# Patient Record
Sex: Female | Born: 1955
Health system: Southern US, Community
[De-identification: ages and names within clinical notes are randomized; demographics above are authoritative.]

## PROBLEM LIST (undated history)

## (undated) DIAGNOSIS — R112 Nausea with vomiting, unspecified: Secondary | ICD-10-CM

## (undated) DIAGNOSIS — C44719 Basal cell carcinoma of skin of left lower limb, including hip: Secondary | ICD-10-CM

## (undated) DIAGNOSIS — Z9889 Other specified postprocedural states: Secondary | ICD-10-CM

## (undated) DIAGNOSIS — E785 Hyperlipidemia, unspecified: Secondary | ICD-10-CM

## (undated) HISTORY — PX: WISDOM TOOTH EXTRACTION: SHX21

## (undated) HISTORY — PX: TUBAL LIGATION: SHX77

## (undated) HISTORY — PX: TONSILLECTOMY: SUR1361

## (undated) HISTORY — PX: MOHS SURGERY: SUR867

## (undated) HISTORY — PX: RHINOPLASTY: SUR1284

## (undated) HISTORY — PX: COLONOSCOPY: SHX174

## (undated) HISTORY — PX: DILATION AND CURETTAGE OF UTERUS: SHX78

## (undated) HISTORY — PX: TONSILLECTOMY AND ADENOIDECTOMY: SHX28

---

## 2003-12-12 ENCOUNTER — Other Ambulatory Visit: Admission: RE | Admit: 2003-12-12 | Discharge: 2003-12-12 | Payer: Self-pay | Admitting: Family Medicine

## 2005-03-04 ENCOUNTER — Other Ambulatory Visit: Admission: RE | Admit: 2005-03-04 | Discharge: 2005-03-04 | Payer: Self-pay | Admitting: Family Medicine

## 2005-03-12 ENCOUNTER — Encounter: Admission: RE | Admit: 2005-03-12 | Discharge: 2005-03-12 | Payer: Self-pay | Admitting: Family Medicine

## 2005-04-02 ENCOUNTER — Ambulatory Visit (HOSPITAL_COMMUNITY): Admission: RE | Admit: 2005-04-02 | Discharge: 2005-04-02 | Payer: Self-pay | Admitting: Obstetrics and Gynecology

## 2005-05-05 ENCOUNTER — Encounter (INDEPENDENT_AMBULATORY_CARE_PROVIDER_SITE_OTHER): Payer: Self-pay | Admitting: *Deleted

## 2005-05-05 ENCOUNTER — Ambulatory Visit (HOSPITAL_COMMUNITY): Admission: RE | Admit: 2005-05-05 | Discharge: 2005-05-05 | Payer: Self-pay | Admitting: Obstetrics and Gynecology

## 2006-03-11 ENCOUNTER — Other Ambulatory Visit: Admission: RE | Admit: 2006-03-11 | Discharge: 2006-03-11 | Payer: Self-pay | Admitting: Family Medicine

## 2006-11-08 ENCOUNTER — Encounter: Admission: RE | Admit: 2006-11-08 | Discharge: 2006-11-08 | Payer: Self-pay | Admitting: Family Medicine

## 2007-03-24 ENCOUNTER — Other Ambulatory Visit: Admission: RE | Admit: 2007-03-24 | Discharge: 2007-03-24 | Payer: Self-pay | Admitting: Family Medicine

## 2007-04-02 ENCOUNTER — Ambulatory Visit (HOSPITAL_COMMUNITY): Admission: RE | Admit: 2007-04-02 | Discharge: 2007-04-02 | Payer: Self-pay | Admitting: Family Medicine

## 2008-05-03 ENCOUNTER — Other Ambulatory Visit: Admission: RE | Admit: 2008-05-03 | Discharge: 2008-05-03 | Payer: Self-pay | Admitting: Family Medicine

## 2009-05-23 ENCOUNTER — Other Ambulatory Visit: Admission: RE | Admit: 2009-05-23 | Discharge: 2009-05-23 | Payer: Self-pay | Admitting: Family Medicine

## 2010-07-02 ENCOUNTER — Other Ambulatory Visit
Admission: RE | Admit: 2010-07-02 | Discharge: 2010-07-02 | Payer: Self-pay | Source: Home / Self Care | Admitting: Family Medicine

## 2010-07-16 ENCOUNTER — Other Ambulatory Visit: Payer: Self-pay | Admitting: Obstetrics and Gynecology

## 2010-11-08 NOTE — Op Note (Signed)
NAME:  Christine Austin, Christine Austin           ACCOUNT NO.:  [id number]   MEDICAL RECORD NO.:  [id number]          PATIENT TYPE:  AMB   LOCATION:  [facility]                           FACILITY:  [facility]   PHYSICIAN:  Dois Davenport A. Rivard, M.D. DATE OF BIRTH:  1955/08/06   DATE OF PROCEDURE:  05/05/2005  DATE OF DISCHARGE:                                 OPERATIVE REPORT   PREOPERATIVE DIAGNOSIS:  Menorrhagia with endometrial polyp.   POSTOPERATIVE DIAGNOSIS:  Menorrhagia with endometrial polyp.  Endocervical  polyp.   PROCEDURE:  Hysteroscopy, resection of endometrial polyp, D&C, and removal  of endocervical polyp.   SURGEON:  Crist Fat. Rivard, M.D.   ANESTHESIA:  General.   ESTIMATED BLOOD LOSS:  Minimal.   DESCRIPTION OF PROCEDURE:  After being informed of the planned procedure  with the possible complications including bleeding, infection, uterine  perforation, informed consent was obtained.  The patient is taken to OR#4  and given general anesthesia with laryngeal mask.  She is placed in the  lithotomy position and prepped and draped in the usual sterile fashion.  Her  bladder is emptied with an in-and-out Foley catheter.  GYN examination  reveals an anteverted uterus, normal in size and shape, and two normal  adnexa.  A weighted speculum is inserted.  Anterior lip of the cervix is  grasped with the [facility] forceps and we proceed with a paracervical block  using Nesacaine 1% 10 mL in the usual fashion.  An endocervical polyp is  easily visualized and removed with ring forceps.  The uterus is then sounded  at 9.5 cm and the cervix is easily dilated using Hegar dilator until #29.  This allows easy entry of the operative hysteroscope with profusion of  Sorbitol 3% at maximum of 80 mmHg we are able to visualize the entire  uterine cavity which reveals a somewhat thickened anterior endometrium with  a left fundal polyp measuring approximately 1.5 cm.  Using the resectoscope,  this polyp is removed and  sent separately.  We are now able to visualize  both tubal ostia which are normal.  We removed the hysteroscope and proceed  with systematic curettage of the endometrial cavity which removes a moderate  amount of normal appearing tissue.  After curettage is completed, the  hysteroscope is reinserted and visualization of the cavity is normal.  Two  sites of bleeding on the fundus are cauterized.  Hysteroscope is then  removed.  Needle, sponge, and instrument counts correct x2.  Estimated blood  loss is minimal.  Water deficit is 30 mL.  The procedure is well tolerated  by the patient who is taken to the recovery room in a well and stable  condition.  Three specimens are sent to pathology, endocervical polyp,  endometrial polyp, and endometrial curettings.      Crist Fat Rivard, M.D.  Electronically Signed     SAR/MEDQ  D:  05/05/2005  T:  05/05/2005  Job:  [id number]

## 2010-12-17 ENCOUNTER — Other Ambulatory Visit: Payer: Self-pay | Admitting: Obstetrics and Gynecology

## 2010-12-17 ENCOUNTER — Ambulatory Visit (HOSPITAL_COMMUNITY)
Admission: EM | Admit: 2010-12-17 | Discharge: 2010-12-17 | Disposition: A | Discharge status: Home / Self Care | Payer: BC Managed Care – PPO | Source: Ambulatory Visit | Attending: Obstetrics and Gynecology | Admitting: Obstetrics and Gynecology

## 2010-12-17 DIAGNOSIS — N840 Polyp of corpus uteri: Secondary | ICD-10-CM | POA: Insufficient documentation

## 2010-12-17 DIAGNOSIS — N950 Postmenopausal bleeding: Secondary | ICD-10-CM | POA: Insufficient documentation

## 2010-12-17 LAB — CBC
Hemoglobin: 14 g/dL (ref 12.0–15.0)
MCH: 30 pg (ref 26.0–34.0)
MCHC: 34.1 g/dL (ref 30.0–36.0)
MCV: 88 fL (ref 78.0–100.0)
Platelets: 232 10*3/uL (ref 150–400)
RBC: 4.67 MIL/uL (ref 3.87–5.11)
RDW: 12.9 % (ref 11.5–15.5)

## 2010-12-27 NOTE — Op Note (Signed)
  NAME:  [name], [name] NO.:  [id number]  MEDICAL RECORD NO.:  [id number]  LOCATION:  [facility]                          FACILITY:  [facility]  PHYSICIAN:  Patsy Baltimore, MD     DATE OF BIRTH:  Jun 07, 1956  DATE OF PROCEDURE:  12/17/2010 DATE OF DISCHARGE:  12/17/2010                              OPERATIVE REPORT   PREOPERATIVE DIAGNOSIS:  Uterine polyp.  POSTOPERATIVE DIAGNOSIS:  Uterine polyp.  PROCEDURE PERFORMED:  Hysteroscopy and polypectomy with MyoSure.  SURGEON:  Patsy Baltimore, MD  ANESTHESIA:  General.  SPECIMENS SENT:  Endometrial polyp.  ESTIMATED BLOOD LOSS:  Minimal.  HISTORY:  [name] is a 55 year old para 4, seen previously with postmenopausal bleeding.  Her endometrial biopsy was negative for atypia or malignancy.  She did have a sonohysterogram done which demonstrated an intrauterine polyp.  Therefore, informed consent was obtained for removal of this polyp and hysteroscopy.  DESCRIPTION OF PROCEDURE:  She was taken to the operating room with IV fluids running.  She was put under anesthesia with ease.  Her legs were lifted up to the dorsal lithotomy position.  She was prepped and draped in the usual sterile fashion and a time-out was called and we began the procedure.  A speculum was inserted into the vagina.  A single-toothed tenaculum was used to grasp the anterior lip of the cervix. Hysteroscopy was done and revealed an intrauterine polyp.  The MyoSure polypectomy instrument was used then to remove the polyps.  This was done over 10-20 seconds.  There was no bleeding after the MyoSure was done.  Hysteroscopy again revealed a clean cavity.  All the instruments were then removed from the vagina.  The patient tolerated the procedure well with tenaculum puncture sites were hemostatic.  She was reawakened and transferred to the PACU in stable condition.          ______________________________ Patsy Baltimore, MD     CO/MEDQ   D:  12/23/2010  T:  12/24/2010  Job:  [id number]  Electronically Signed by Patsy Baltimore MD on 12/27/2010 10:33:54 AM

## 2013-01-20 ENCOUNTER — Other Ambulatory Visit: Payer: Self-pay | Admitting: Dermatology

## 2014-02-01 ENCOUNTER — Other Ambulatory Visit: Payer: Self-pay | Admitting: Dermatology

## 2015-10-29 ENCOUNTER — Other Ambulatory Visit: Payer: Self-pay | Admitting: Family Medicine

## 2015-10-29 ENCOUNTER — Other Ambulatory Visit (HOSPITAL_COMMUNITY)
Admission: RE | Admit: 2015-10-29 | Discharge: 2015-10-29 | Disposition: A | Discharge status: Home / Self Care | Payer: BLUE CROSS/BLUE SHIELD | Source: Ambulatory Visit | Attending: Family Medicine | Admitting: Family Medicine

## 2015-10-29 DIAGNOSIS — Z1151 Encounter for screening for human papillomavirus (HPV): Secondary | ICD-10-CM | POA: Diagnosis not present

## 2015-10-29 DIAGNOSIS — Z01411 Encounter for gynecological examination (general) (routine) with abnormal findings: Secondary | ICD-10-CM | POA: Insufficient documentation

## 2015-10-31 LAB — CYTOLOGY - PAP

## 2015-11-22 ENCOUNTER — Other Ambulatory Visit: Payer: Self-pay | Admitting: Obstetrics and Gynecology

## 2016-11-24 ENCOUNTER — Other Ambulatory Visit: Payer: Self-pay | Admitting: Obstetrics and Gynecology

## 2016-11-24 ENCOUNTER — Other Ambulatory Visit (HOSPITAL_COMMUNITY)
Admission: RE | Admit: 2016-11-24 | Discharge: 2016-11-24 | Disposition: A | Discharge status: Home / Self Care | Payer: BLUE CROSS/BLUE SHIELD | Source: Ambulatory Visit | Attending: Obstetrics and Gynecology | Admitting: Obstetrics and Gynecology

## 2016-11-24 DIAGNOSIS — Z124 Encounter for screening for malignant neoplasm of cervix: Secondary | ICD-10-CM | POA: Diagnosis present

## 2016-11-24 DIAGNOSIS — R87810 Cervical high risk human papillomavirus (HPV) DNA test positive: Secondary | ICD-10-CM | POA: Insufficient documentation

## 2016-11-27 LAB — CYTOLOGY - PAP
Diagnosis: UNDETERMINED — AB
HPV (WINDOPATH): DETECTED
HPV 16/18/45 genotyping: NEGATIVE

## 2016-12-25 ENCOUNTER — Other Ambulatory Visit: Payer: Self-pay | Admitting: Obstetrics and Gynecology

## 2017-07-28 DIAGNOSIS — H0100B Unspecified blepharitis left eye, upper and lower eyelids: Secondary | ICD-10-CM | POA: Diagnosis not present

## 2017-07-28 DIAGNOSIS — H0100A Unspecified blepharitis right eye, upper and lower eyelids: Secondary | ICD-10-CM | POA: Diagnosis not present

## 2017-07-28 DIAGNOSIS — H00012 Hordeolum externum right lower eyelid: Secondary | ICD-10-CM | POA: Diagnosis not present

## 2017-08-17 DIAGNOSIS — B349 Viral infection, unspecified: Secondary | ICD-10-CM | POA: Diagnosis not present

## 2017-11-21 HISTORY — PX: OTHER SURGICAL HISTORY: SHX169

## 2017-11-26 ENCOUNTER — Other Ambulatory Visit (HOSPITAL_COMMUNITY)
Admission: RE | Admit: 2017-11-26 | Discharge: 2017-11-26 | Disposition: A | Discharge status: Home / Self Care | Payer: BLUE CROSS/BLUE SHIELD | Source: Ambulatory Visit | Attending: Obstetrics and Gynecology | Admitting: Obstetrics and Gynecology

## 2017-11-26 ENCOUNTER — Other Ambulatory Visit: Payer: Self-pay | Admitting: Obstetrics and Gynecology

## 2017-11-26 DIAGNOSIS — Z01411 Encounter for gynecological examination (general) (routine) with abnormal findings: Secondary | ICD-10-CM | POA: Diagnosis not present

## 2017-11-26 DIAGNOSIS — Z01419 Encounter for gynecological examination (general) (routine) without abnormal findings: Secondary | ICD-10-CM | POA: Diagnosis not present

## 2017-12-02 DIAGNOSIS — Z5181 Encounter for therapeutic drug level monitoring: Secondary | ICD-10-CM | POA: Diagnosis not present

## 2017-12-02 DIAGNOSIS — E782 Mixed hyperlipidemia: Secondary | ICD-10-CM | POA: Diagnosis not present

## 2017-12-02 DIAGNOSIS — Z0000 Encounter for general adult medical examination without abnormal findings: Secondary | ICD-10-CM | POA: Diagnosis not present

## 2017-12-03 LAB — CYTOLOGY - PAP
Diagnosis: HIGH — AB
HPV 16/18/45 genotyping: NEGATIVE
HPV: DETECTED — AB

## 2017-12-29 ENCOUNTER — Other Ambulatory Visit: Payer: Self-pay | Admitting: Obstetrics and Gynecology

## 2017-12-29 DIAGNOSIS — D069 Carcinoma in situ of cervix, unspecified: Secondary | ICD-10-CM | POA: Diagnosis not present

## 2017-12-29 DIAGNOSIS — N871 Moderate cervical dysplasia: Secondary | ICD-10-CM | POA: Diagnosis not present

## 2018-01-11 ENCOUNTER — Encounter (HOSPITAL_COMMUNITY): Payer: Self-pay | Admitting: *Deleted

## 2018-01-11 ENCOUNTER — Other Ambulatory Visit: Payer: Self-pay

## 2018-01-12 DIAGNOSIS — Z780 Asymptomatic menopausal state: Secondary | ICD-10-CM | POA: Diagnosis not present

## 2018-01-12 DIAGNOSIS — M8588 Other specified disorders of bone density and structure, other site: Secondary | ICD-10-CM | POA: Diagnosis not present

## 2018-01-21 ENCOUNTER — Ambulatory Visit (HOSPITAL_COMMUNITY): Payer: BLUE CROSS/BLUE SHIELD | Admitting: Anesthesiology

## 2018-01-21 ENCOUNTER — Other Ambulatory Visit: Payer: Self-pay

## 2018-01-21 ENCOUNTER — Encounter (HOSPITAL_COMMUNITY)
Admission: AD | Disposition: A | Discharge status: Home / Self Care | Payer: Self-pay | Source: Ambulatory Visit | Attending: Obstetrics and Gynecology

## 2018-01-21 ENCOUNTER — Encounter (HOSPITAL_COMMUNITY): Payer: Self-pay

## 2018-01-21 ENCOUNTER — Ambulatory Visit (HOSPITAL_COMMUNITY)
Admission: AD | Admit: 2018-01-21 | Discharge: 2018-01-21 | Disposition: A | Discharge status: Home / Self Care | Payer: BLUE CROSS/BLUE SHIELD | Source: Ambulatory Visit | Attending: Obstetrics and Gynecology | Admitting: Obstetrics and Gynecology

## 2018-01-21 DIAGNOSIS — Z8249 Family history of ischemic heart disease and other diseases of the circulatory system: Secondary | ICD-10-CM | POA: Insufficient documentation

## 2018-01-21 DIAGNOSIS — N871 Moderate cervical dysplasia: Secondary | ICD-10-CM | POA: Insufficient documentation

## 2018-01-21 DIAGNOSIS — Z79899 Other long term (current) drug therapy: Secondary | ICD-10-CM | POA: Diagnosis not present

## 2018-01-21 DIAGNOSIS — N888 Other specified noninflammatory disorders of cervix uteri: Secondary | ICD-10-CM | POA: Diagnosis not present

## 2018-01-21 DIAGNOSIS — D069 Carcinoma in situ of cervix, unspecified: Secondary | ICD-10-CM | POA: Diagnosis not present

## 2018-01-21 DIAGNOSIS — R87613 High grade squamous intraepithelial lesion on cytologic smear of cervix (HGSIL): Secondary | ICD-10-CM | POA: Diagnosis not present

## 2018-01-21 HISTORY — DX: Hyperlipidemia, unspecified: E78.5

## 2018-01-21 HISTORY — DX: Nausea with vomiting, unspecified: R11.2

## 2018-01-21 HISTORY — DX: Other specified postprocedural states: Z98.890

## 2018-01-21 HISTORY — PX: CERVICAL CONIZATION W/BX: SHX1330

## 2018-01-21 LAB — CBC
HEMATOCRIT: 42.7 % (ref 36.0–46.0)
HEMOGLOBIN: 14.5 g/dL (ref 12.0–15.0)
MCH: 30.4 pg (ref 26.0–34.0)
MCHC: 34 g/dL (ref 30.0–36.0)
MCV: 89.5 fL (ref 78.0–100.0)
Platelets: 218 10*3/uL (ref 150–400)
RBC: 4.77 MIL/uL (ref 3.87–5.11)
RDW: 12.6 % (ref 11.5–15.5)
WBC: 5.2 10*3/uL (ref 4.0–10.5)

## 2018-01-21 SURGERY — CONE BIOPSY, CERVIX
Anesthesia: General

## 2018-01-21 MED ORDER — FAMOTIDINE 20 MG PO TABS
20.0000 mg | ORAL_TABLET | Freq: Once | ORAL | Status: AC
  Administered 2018-01-21: 20 mg via ORAL

## 2018-01-21 MED ORDER — IODINE STRONG (LUGOLS) 5 % PO SOLN
ORAL | Status: DC | PRN
  Administered 2018-01-21: 0.1 mL via ORAL

## 2018-01-21 MED ORDER — MIDAZOLAM HCL 2 MG/2ML IJ SOLN
INTRAMUSCULAR | Status: AC
  Filled 2018-01-21: qty 2

## 2018-01-21 MED ORDER — LIDOCAINE HCL (CARDIAC) PF 100 MG/5ML IV SOSY
PREFILLED_SYRINGE | INTRAVENOUS | Status: DC | PRN
  Administered 2018-01-21: 100 mg via INTRAVENOUS

## 2018-01-21 MED ORDER — CELECOXIB 200 MG PO CAPS
ORAL_CAPSULE | ORAL | Status: AC
  Filled 2018-01-21: qty 1

## 2018-01-21 MED ORDER — OXYCODONE HCL 5 MG PO TABS
ORAL_TABLET | ORAL | Status: AC
  Filled 2018-01-21: qty 1

## 2018-01-21 MED ORDER — FENTANYL CITRATE (PF) 100 MCG/2ML IJ SOLN
INTRAMUSCULAR | Status: DC | PRN
  Administered 2018-01-21: 50 ug via INTRAVENOUS

## 2018-01-21 MED ORDER — ACETAMINOPHEN 160 MG/5ML PO SOLN: 960.0000 mg | Freq: Once | ORAL | Status: AC

## 2018-01-21 MED ORDER — FENTANYL CITRATE (PF) 100 MCG/2ML IJ SOLN
25.0000 ug | INTRAMUSCULAR | Status: DC | PRN
  Administered 2018-01-21 (×2): 50 ug via INTRAVENOUS

## 2018-01-21 MED ORDER — OXYCODONE HCL 5 MG/5ML PO SOLN: 5.0000 mg | Freq: Once | ORAL | Status: AC | PRN

## 2018-01-21 MED ORDER — FENTANYL CITRATE (PF) 100 MCG/2ML IJ SOLN
INTRAMUSCULAR | Status: AC
  Filled 2018-01-21: qty 2

## 2018-01-21 MED ORDER — OXYCODONE HCL 5 MG PO TABS
5.0000 mg | ORAL_TABLET | Freq: Once | ORAL | Status: AC | PRN
  Administered 2018-01-21: 5 mg via ORAL

## 2018-01-21 MED ORDER — LIDOCAINE HCL (CARDIAC) PF 100 MG/5ML IV SOSY
PREFILLED_SYRINGE | INTRAVENOUS | Status: AC
  Filled 2018-01-21: qty 5

## 2018-01-21 MED ORDER — DEXAMETHASONE SODIUM PHOSPHATE 10 MG/ML IJ SOLN
INTRAMUSCULAR | Status: DC | PRN
  Administered 2018-01-21: 4 mg via INTRAVENOUS

## 2018-01-21 MED ORDER — ACETAMINOPHEN 500 MG PO TABS
1000.0000 mg | ORAL_TABLET | Freq: Once | ORAL | Status: AC
  Administered 2018-01-21: 1000 mg via ORAL

## 2018-01-21 MED ORDER — FERRIC SUBSULFATE 259 MG/GM EX SOLN
CUTANEOUS | Status: AC
  Filled 2018-01-21: qty 8

## 2018-01-21 MED ORDER — MIDAZOLAM HCL 2 MG/2ML IJ SOLN
INTRAMUSCULAR | Status: DC | PRN
  Administered 2018-01-21: 2 mg via INTRAVENOUS

## 2018-01-21 MED ORDER — KETOROLAC TROMETHAMINE 30 MG/ML IJ SOLN
INTRAMUSCULAR | Status: AC
  Filled 2018-01-21: qty 1

## 2018-01-21 MED ORDER — LIDOCAINE-EPINEPHRINE 1 %-1:100000 IJ SOLN
INTRAMUSCULAR | Status: DC | PRN
  Administered 2018-01-21: 10 mL

## 2018-01-21 MED ORDER — FENTANYL CITRATE (PF) 100 MCG/2ML IJ SOLN
INTRAMUSCULAR | Status: AC
  Administered 2018-01-21: 50 ug via INTRAVENOUS
  Filled 2018-01-21: qty 2

## 2018-01-21 MED ORDER — DEXAMETHASONE SODIUM PHOSPHATE 4 MG/ML IJ SOLN
INTRAMUSCULAR | Status: AC
  Filled 2018-01-21: qty 1

## 2018-01-21 MED ORDER — ONDANSETRON HCL 4 MG/2ML IJ SOLN: 4.0000 mg | Freq: Once | INTRAMUSCULAR | Status: DC | PRN

## 2018-01-21 MED ORDER — PROPOFOL 10 MG/ML IV BOLUS
INTRAVENOUS | Status: AC
  Filled 2018-01-21: qty 20

## 2018-01-21 MED ORDER — ACETAMINOPHEN 500 MG PO TABS
ORAL_TABLET | ORAL | Status: AC
  Filled 2018-01-21: qty 2

## 2018-01-21 MED ORDER — ONDANSETRON HCL 4 MG/2ML IJ SOLN
INTRAMUSCULAR | Status: DC | PRN
  Administered 2018-01-21: 4 mg via INTRAVENOUS

## 2018-01-21 MED ORDER — IODINE STRONG (LUGOLS) 5 % PO SOLN
ORAL | Status: AC
  Filled 2018-01-21: qty 1

## 2018-01-21 MED ORDER — FERRIC SUBSULFATE SOLN
Status: DC | PRN
  Administered 2018-01-21: 1

## 2018-01-21 MED ORDER — CELECOXIB 200 MG PO CAPS
200.0000 mg | ORAL_CAPSULE | Freq: Once | ORAL | Status: AC | PRN
  Administered 2018-01-21: 200 mg via ORAL

## 2018-01-21 MED ORDER — ONDANSETRON HCL 4 MG/2ML IJ SOLN
INTRAMUSCULAR | Status: AC
  Filled 2018-01-21: qty 2

## 2018-01-21 MED ORDER — LACTATED RINGERS IV SOLN: INTRAVENOUS | Status: DC

## 2018-01-21 MED ORDER — IBUPROFEN 600 MG PO TABS: 600.0000 mg | ORAL_TABLET | Freq: Four times a day (QID) | ORAL | 0 refills | Status: DC | PRN

## 2018-01-21 MED ORDER — LIDOCAINE-EPINEPHRINE 1 %-1:100000 IJ SOLN
INTRAMUSCULAR | Status: AC
  Filled 2018-01-21: qty 1

## 2018-01-21 MED ORDER — PROPOFOL 10 MG/ML IV BOLUS
INTRAVENOUS | Status: DC | PRN
  Administered 2018-01-21: 50 mg via INTRAVENOUS
  Administered 2018-01-21: 150 mg via INTRAVENOUS

## 2018-01-21 MED ORDER — ACETIC ACID 5 % SOLN
Status: AC
  Filled 2018-01-21: qty 500

## 2018-01-21 MED ORDER — LACTATED RINGERS IV SOLN
INTRAVENOUS | Status: DC | PRN
  Administered 2018-01-21: 12:00:00 via INTRAVENOUS

## 2018-01-21 MED ORDER — FAMOTIDINE 20 MG PO TABS
ORAL_TABLET | ORAL | Status: AC
  Filled 2018-01-21: qty 1

## 2018-01-21 SURGICAL SUPPLY — 29 items
APPLICATOR COTTON TIP 6 STRL (MISCELLANEOUS) IMPLANT
APPLICATOR COTTON TIP 6IN STRL (MISCELLANEOUS)
BLADE SURG 11 STRL SS (BLADE) ×6 IMPLANT
CATH ROBINSON RED A/P 16FR (CATHETERS) ×3 IMPLANT
ELECT BALL LEEP 5MM RED (ELECTRODE) ×3 IMPLANT
ELECT REM PT RETURN 9FT ADLT (ELECTROSURGICAL) ×3
ELECTRODE REM PT RTRN 9FT ADLT (ELECTROSURGICAL) ×1 IMPLANT
GLOVE BIO SURGEON STRL SZ7 (GLOVE) ×3 IMPLANT
GLOVE BIOGEL PI IND STRL 7.0 (GLOVE) ×2 IMPLANT
GLOVE BIOGEL PI INDICATOR 7.0 (GLOVE) ×4
GOWN STRL REUS W/TWL LRG LVL3 (GOWN DISPOSABLE) ×6 IMPLANT
HEMOSTAT ARISTA ABSORB 3G PWDR (MISCELLANEOUS) IMPLANT
HEMOSTAT SURGICEL 2X3 (HEMOSTASIS) IMPLANT
NS IRRIG 1000ML POUR BTL (IV SOLUTION) ×3 IMPLANT
PACK VAGINAL MINOR WOMEN LF (CUSTOM PROCEDURE TRAY) ×3 IMPLANT
PAD OB MATERNITY 4.3X12.25 (PERSONAL CARE ITEMS) ×3 IMPLANT
PENCIL BUTTON HOLSTER BLD 10FT (ELECTRODE) ×3 IMPLANT
SCOPETTES 8  STERILE (MISCELLANEOUS) ×2
SCOPETTES 8 STERILE (MISCELLANEOUS) ×1 IMPLANT
SPONGE SURGIFOAM ABS GEL 12-7 (HEMOSTASIS) IMPLANT
SUT VIC AB 0 CT1 27 (SUTURE) ×4
SUT VIC AB 0 CT1 27XBRD ANBCTR (SUTURE) ×2 IMPLANT
SUT VIC AB 0 CT1 36 (SUTURE) ×6 IMPLANT
SUT VIC AB 3-0 CT1 27 (SUTURE) ×4
SUT VIC AB 3-0 CT1 TAPERPNT 27 (SUTURE) ×2 IMPLANT
TOWEL OR 17X24 6PK STRL BLUE (TOWEL DISPOSABLE) ×6 IMPLANT
TUBING NON-CON 1/4 X 20 CONN (TUBING) ×2 IMPLANT
TUBING NON-CON 1/4 X 20' CONN (TUBING) ×1
YANKAUER SUCT BULB TIP NO VENT (SUCTIONS) ×3 IMPLANT

## 2018-01-21 NOTE — Interval H&P Note (Signed)
History and Physical Interval Note:  01/21/2018 12:47 PM  Christine Austin  has presented today for surgery, with the diagnosis of D06.9 Severe Dysplasia  The various methods of treatment have been discussed with the patient and family. After consideration of risks, benefits and other options for treatment, the patient has consented to  Procedure(s): CONIZATION CERVIX WITH BIOPSY (N/A) as a surgical intervention .  The patient's history has been reviewed, patient examined, no change in status, stable for surgery.  I have reviewed the patient's chart and labs.  Questions were answered to the patient's satisfaction.     Thurnell Lose

## 2018-01-21 NOTE — Anesthesia Preprocedure Evaluation (Addendum)
Anesthesia Evaluation  Patient identified by MRN, date of birth, ID band Patient awake    Reviewed: Allergy & Precautions, NPO status , Patient's Chart, lab work & pertinent test results  History of Anesthesia Complications (+) PONV and history of anesthetic complications  Airway Mallampati: II  TM Distance: >3 FB Neck ROM: Full    Dental no notable dental hx.    Pulmonary neg pulmonary ROS,    Pulmonary exam normal breath sounds clear to auscultation       Cardiovascular negative cardio ROS Normal cardiovascular exam Rhythm:Regular Rate:Normal     Neuro/Psych negative neurological ROS  negative psych ROS   GI/Hepatic negative GI ROS, Neg liver ROS,   Endo/Other  negative endocrine ROS  Renal/GU negative Renal ROS     Musculoskeletal negative musculoskeletal ROS (+)   Abdominal   Peds  Hematology HLD   Anesthesia Other Findings Severe Dysplasia  Reproductive/Obstetrics                            Anesthesia Physical Anesthesia Plan  ASA: II  Anesthesia Plan: General   Post-op Pain Management:    Induction: Intravenous  PONV Risk Score and Plan: 4 or greater and Scopolamine patch - Pre-op, Midazolam, Dexamethasone, Ondansetron and Treatment may vary due to age or medical condition  Airway Management Planned: LMA  Additional Equipment:   Intra-op Plan:   Post-operative Plan: Extubation in OR  Informed Consent: I have reviewed the patients History and Physical, chart, labs and discussed the procedure including the risks, benefits and alternatives for the proposed anesthesia with the patient or authorized representative who has indicated his/her understanding and acceptance.   Dental advisory given  Plan Discussed with: CRNA  Anesthesia Plan Comments:         Anesthesia Quick Evaluation

## 2018-01-21 NOTE — Discharge Instructions (Addendum)
Cervical Conization, Care After °This sheet gives you information about how to care for yourself after your procedure. Your doctor may also give you more specific instructions. If you have problems or questions, contact your doctor. °Follow these instructions at home: °Medicines °· Take over-the-counter and prescription medicines only as told by your doctor. °· Do not take aspirin until your doctor says it is okay. °· If you take pain medicine: °? You may have constipation. To help treat this, your doctor may tell you to: °§ Drink enough fluid to keep your pee (urine) clear or pale yellow. °§ Take medicines. °§ Eat foods that are high in fiber. These include fresh fruits and vegetables, whole grains, bran, and beans. °§ Limit foods that are high in fat and sugar. These include fried foods and sweet foods. °? Do not drive or use heavy machines. °General instructions °· You can eat your usual diet unless your doctor tells you not to do so. °· Take showers for the first week. Do not take baths, swim, or use hot tubs until your doctor says it is okay. °· Do not douche, use tampons, or have sex until your doctor says it is okay. °· For 7-14 days after your procedure, avoid: °? Being very active. °? Exercising. °? Heavy lifting. °· Keep all follow-up visits as told by your doctor. This is important. °Contact a doctor if: °· You have a rash. °· You are dizzy or lightheaded. °· You feel sick to your stomach (nauseous). °· You throw up (vomit). °· You have fluid from your vagina (vaginal discharge) that smells bad. °Get help right away if: °· There are blood clots coming from your vagina. °· You have more bleeding than you would have in a normal period. For example, you soak a pad in less than 1 hour. °· You have a fever. °· You have more and more cramps. °· You pass out (faint). °· You have pain when peeing. °· Your have a lot of pain. °· Your pain gets worse. °· Your pain does not get better when you take your  medicine. °· You have blood in your pee. °· You throw up (vomit). °Summary °· After your procedure, take over-the-counter and prescription medicines only as told by your doctor. °· Do not douche, use tampons, or have sex until your doctor says it is okay. °· For about 7-14 days after your procedure, try not to exercise or lift heavy objects. °· Get help right away if you have new symptoms, or if your symptoms become worse. °This information is not intended to replace advice given to you by your health care provider. Make sure you discuss any questions you have with your health care provider. °Document Released: 03/18/2008 Document Revised: 06/11/2016 Document Reviewed: 06/11/2016 °Elsevier Interactive Patient Education © 2017 [employer] ° ° °Post Anesthesia Home Care Instructions ° °Activity: °Get plenty of rest for the remainder of the day. A responsible individual must stay with you for 24 hours following the procedure.  °For the next 24 hours, DO NOT: °-Drive a car °-Operate machinery °-Drink alcoholic beverages °-Take any medication unless instructed by your physician °-Make any legal decisions or sign important papers. ° °Meals: °Start with liquid foods such as gelatin or soup. Progress to regular foods as tolerated. Avoid greasy, spicy, heavy foods. If nausea and/or vomiting occur, drink only clear liquids until the nausea and/or vomiting subsides. Call your physician if vomiting continues. ° °Special Instructions/Symptoms: °Your throat may feel dry or sore from   from the anesthesia or the breathing tube placed in your throat during surgery. If this causes discomfort, gargle with warm salt water. The discomfort should disappear within 24 hours.

## 2018-01-21 NOTE — Brief Op Note (Signed)
01/21/2018  1:47 PM  PATIENT:  Christine Austin  62 y.o. female  PRE-OPERATIVE DIAGNOSIS:  D06.9 Severe Cervical Dysplasia  POST-OPERATIVE DIAGNOSIS:  D06.9 Severe Cervical Dysplasia  PROCEDURE:  Procedure(s): CONIZATION CERVIX WITH BIOPSY (N/A)  SURGEON:  Surgeon(s) and Role:    Thurnell Lose, MD - Primary  PHYSICIAN ASSISTANT:   ASSISTANTS: Technician   ANESTHESIA:   general  EBL:  10 mL   BLOOD ADMINISTERED:none  DRAINS: none   LOCAL MEDICATIONS USED:  LIDOCAINE  w/ epinephrine 1:100 and Amount: 10 ml  SPECIMEN:  Cone biopsy of cervix, endocervical currettings  DISPOSITION OF SPECIMEN:  PATHOLOGY  COUNTS:  YES  TOURNIQUET:  * No tourniquets in log *  DICTATION: .Other Dictation: Dictation Number [id number]  PLAN OF CARE: Discharge to home after PACU  PATIENT DISPOSITION:  PACU - hemodynamically stable.   Delay start of Pharmacological VTE agent (>24hrs) due to surgical blood loss or risk of bleeding: not applicable

## 2018-01-21 NOTE — Anesthesia Postprocedure Evaluation (Signed)
Anesthesia Post Note  Patient: Christine Austin  Procedure(s) Performed: CONIZATION CERVIX WITH BIOPSY (N/A )     Patient location during evaluation: PACU Anesthesia Type: General Level of consciousness: awake and alert Pain management: pain level controlled Vital Signs Assessment: post-procedure vital signs reviewed and stable Respiratory status: spontaneous breathing, nonlabored ventilation, respiratory function stable and patient connected to nasal cannula oxygen Cardiovascular status: blood pressure returned to baseline and stable Postop Assessment: no apparent nausea or vomiting Anesthetic complications: no    Last Vitals:  Vitals:   01/21/18 1445 01/21/18 1500  BP: 116/67 120/74  Pulse: 81 79  Resp: 17 19  Temp:    SpO2: 97% 98%    Last Pain:  Vitals:   01/21/18 1500  TempSrc:   PainSc: 3    Pain Goal: Patients Stated Pain Goal: 5 (01/21/18 1121)               Kennie Snedden P Hammad Finkler

## 2018-01-21 NOTE — Anesthesia Procedure Notes (Signed)
Procedure Name: LMA Insertion Date/Time: 01/21/2018 12:57 PM Performed by: Hewitt Blade, CRNA Pre-anesthesia Checklist: Patient identified, Emergency Drugs available, Suction available and Patient being monitored Patient Re-evaluated:Patient Re-evaluated prior to induction Oxygen Delivery Method: Circle system utilized Preoxygenation: Pre-oxygenation with 100% oxygen Induction Type: IV induction LMA: LMA inserted LMA Size: 4.0 Number of attempts: 1 Placement Confirmation: positive ETCO2 and breath sounds checked- equal and bilateral Tube secured with: Tape Dental Injury: Teeth and Oropharynx as per pre-operative assessment

## 2018-01-21 NOTE — Op Note (Signed)
NAME: Christine Austin, Christine Austin MEDICAL RECORD [id number] ACCOUNT [id number] DATE OF BIRTH:1955-12-30 FACILITY: [facility] LOCATION: [facility] PHYSICIAN:Kaeya Schiffer Al Decant, MD  OPERATIVE REPORT  DATE OF PROCEDURE:  01/21/2018  PREOPERATIVE DIAGNOSIS:  Severe cervical dysplasia (CIN 2-CIS).  POSTOPERATIVE DIAGNOSIS:  Severe cervical dysplasia (CIN 2-CIS).  PROCEDURE:  Cold knife conization of the cervix.  SURGEON:  Thurnell Lose, MD  ASSISTANT:  Technician.  ANESTHESIA:  General.  ESTIMATED BLOOD LOSS:  10 mL.  DRAINS:  None.  LOCAL:  Lidocaine with epinephrine 1:100, 10 mL.  SPECIMENS:  Cone biopsy of the cervix.  Endocervical curettings.  DISPOSITION OF SPECIMEN:  To pathology.  PATIENT DISPOSITION:  To PACU hemodynamically stable.  COMPLICATIONS:  None.  FINDINGS:  Atrophic-appearing cervix with Lugol's uptake sustaining close to the vaginal fornix.  Normal vagina.  DESCRIPTION OF PROCEDURE:  The patient was identified in the holding area.  She was then taken to the operating room with IV running.  She was prepped and draped in normal sterile fashion.  Timeout was performed.  SCDs were on her legs and operating.    Weighted speculum was placed in the vagina with a Deaver to visualize the cervix.  Lugol's was then applied to determine margins.  Stay sutures were then placed at 3 and 9 in the paracervical space.  I did misplace 2, which were used for retraction, but  were eventually cut.  The cervix was then incised with the #11 blade scalpel and then I injected with the epinephrine.  Cervical margins were determined by the Lugol's and a circumferential incision was made on the cervix and then carried down with the angled blade in a cone  shaped fashion.  Prior to removing the specimen from the cervix, it was tacked with a 3-0 Vicryl at 12 o'clock.  The specimen was then removed.  The cone base was then cauterized with the Bovie.  There was bleeding at 6 o'clock, so I placed  one 3-0 suture for hemostasis.  The rollerball was used for cautery and then Monsel's was applied.  Stay sutures were then cut with the tag left.  All  instruments were removed from the vagina.  The patient tolerated the procedure well.  TN/NUANCE  D:01/21/2018 T:01/21/2018 JOB:[id number]

## 2018-01-21 NOTE — H&P (Signed)
History of Present Illness  General:  Pt presents for f/u colposcopy. H/o CIN I. Most recent pap is HGSIL/HPV+ Pap/Colpo history: 06/2010 Neg/HPV neg pap 10/2015 Neg/HPV+ pap 11/2015 Colpo CIN I 11/2016 ASCUS/HPV+ pap 12/2016 CIN I with HPV changes, ECC negative 11/2017 HGSIL/HPV+.   Current Medications  Taking   Calcium . tablet one tab orally daily-sometimes   Melatonin 5 MG Tablet 1 tablet at bedtime as needed with food Orally Once a day   Pravastatin Sodium 20 MG Tablet 1 tablet Orally Once a day   Not-Taking   PredniSONE 20 MG Tablet 2 tab Orally Once a day   Medication List reviewed and reconciled with the patient    Past Medical History  elevated cholesterol & LDL.   Menometrorrhagia s/p endometrial polyp excsision 2012.   DERM- Dr. Tonia Brooms, hx of atypical moles.   postmenopausal- LMP 02/2009.   BCC on LLE s/p Mohs- Dr. Link Snuffer ( surgeon).   Hx of benign endometrial polyp s/p excision.   ?early macular degeneration- Drussen bodies present.   Menopausal- natural at age 58.   Situational anxiety.           Surgical History  broken arm, nose, jaw dislocation as child (right arm, hand )   D&C-miscarriage [place]  nose reconstruction 1971?  hysteroscopy 11/2010  polypectomy 11/2010  Mohs surgery on left lower leg 2012  wisdom teeth 1974  Colonoscopy 05/2017   Family History  Father: deceased 39 yrs, liver cancer, hypertension, diagnosed with Hypertension  Mother: alive 64 yrs, had MI 8/08- ? myocarditis, hypercholesterolemia, low blood pressure, Coronary artery disease  Brother 1: alive 65 yrs, elevated cholesterol  Sister 1: alive 39 yrs, hypercholesterolemia  Sister 2: alive 34 yrs, hypercholesterolemia  Maternal aunt: deceased, Breast cancer in 33  1 son(s) , 3 daughter(s) - healthy.   No hx of premature CAD\\\\\\\/CVA. negative family history for ovarian, or colon cancer.   Social History  General:  EXPOSURE TO PASSIVE  SMOKE: yes, husband smokes.  Alcohol: yes, wine 1-2 glasses per wk.  Children: 3 daughter age 23,31,226 1 son age 33; 47 granddaughter.  Caffeine: yes, tea, 2-3 serving daily.  DIET: eats healthy- lots of veggies and protein.  Seat belt use: yes.  Tobacco use  cigarettes: Never smoked Tobacco history last updated 54/62/7035 Religion: Catholic.  Marital Status: married.  no Recreational drug use.  OCCUPATION: employed, Pharmacist, hospital, Regulatory affairs officer.  Exercise: yes, walking.    Gyn History  Sexual activity currently sexually active.  Periods : LMP was 05/2010, postmenopausal.  LMP 05/2010- LMP.  Birth control BTL.  Last pap smear date 11/2017 .  Last mammogram date 01/2017 .  Abnormal pap smear assessed with colposcopy, CIN I.  menopause at age 47.    OB History  OB History Gravida 5, Para 4, AB 1.  Number of pregnancies 5.  Pregnancy # 1 live birth, vaginal delivery.  Pregnancy # 2 miscarriage.  Pregnancy # 3 live birth, vaginal delivery.  Pregnancy # 4: live birth, vaginal delivery.  Pregnancy # 5: Live Birth, vaginal delivery.    Allergies  N.K.D.A.   Hospitalization/Major Diagnostic Procedure  childbirth [phone]  see surgery   none in the past year 11/2016   None this past yr 11/2017   Vital Signs  Wt 168.4, Wt change 6.2 lb, Ht 65, BMI 28.02, Pulse sitting 93, BP sitting 124/84.   Physical Examination  GENERAL:  Patient appears alert and oriented.  General Appearance: well-appearing,  well-developed, no acute distress.  Speech: clear.  FEMALE GENITOURINARY:  Cervix visualized, healthy appearing, no discharge, no lesions.  Vagina: pink/moist mucosa, no lesions, no abnormal discharge.  Vulva: normal, no lesions, no skin discoloration.     Assessments   1. HGSIL on Pap smear of cervix - R87.613 (Primary)   2. CIN I (cervical intraepithelial neoplasia I) - N87.0, H/o   Procedures  Colposcopy:  Consent Consent signed. Possible risks explained to patient who  states understanding.Marland Kitchen  Results See drawing for further details.  Type of lesion No ACW. Decreased Lugol's on the entire cervix. Biospy (2,5,7,10 ).  ECC done.  Impression CIN I..  Future management Repeat pap in 1 year if CIN 1, LEEP if CIN 2..       Procedure Codes  [phone] COLPO CERVIX & ADJ VAG W/BX   Follow Up  1 Year if mild dysplasia (Reason: Repeat pap/HPV)

## 2018-01-21 NOTE — Transfer of Care (Signed)
Immediate Anesthesia Transfer of Care Note  Patient: Christine Austin  Procedure(s) Performed: CONIZATION CERVIX WITH BIOPSY (N/A )  Patient Location: PACU  Anesthesia Type:General  Level of Consciousness: awake, alert  and oriented  Airway & Oxygen Therapy: Patient Spontanous Breathing and Patient connected to nasal cannula oxygen  Post-op Assessment: Report given to RN, Post -op Vital signs reviewed and stable and Patient moving all extremities  Post vital signs: Reviewed and stable  Last Vitals:  Vitals Value Taken Time  BP 123/72 01/21/2018  1:55 PM  Temp    Pulse 91 01/21/2018  1:57 PM  Resp 16 01/21/2018  1:57 PM  SpO2 99 % 01/21/2018  1:57 PM  Vitals shown include unvalidated device data.  Last Pain:  Vitals:   01/21/18 1121  TempSrc: Oral  PainSc: 0-No pain      Patients Stated Pain Goal: 5 (09/04/92 5859)  Complications: No apparent anesthesia complications

## 2018-01-22 ENCOUNTER — Encounter (HOSPITAL_COMMUNITY): Payer: Self-pay | Admitting: Obstetrics and Gynecology

## 2018-01-27 DIAGNOSIS — Z1231 Encounter for screening mammogram for malignant neoplasm of breast: Secondary | ICD-10-CM | POA: Diagnosis not present

## 2018-02-01 DIAGNOSIS — Z85828 Personal history of other malignant neoplasm of skin: Secondary | ICD-10-CM | POA: Diagnosis not present

## 2018-02-01 DIAGNOSIS — L821 Other seborrheic keratosis: Secondary | ICD-10-CM | POA: Diagnosis not present

## 2018-02-01 DIAGNOSIS — L814 Other melanin hyperpigmentation: Secondary | ICD-10-CM | POA: Diagnosis not present

## 2018-02-01 DIAGNOSIS — D1801 Hemangioma of skin and subcutaneous tissue: Secondary | ICD-10-CM | POA: Diagnosis not present

## 2018-02-04 DIAGNOSIS — M85852 Other specified disorders of bone density and structure, left thigh: Secondary | ICD-10-CM | POA: Diagnosis not present

## 2018-03-04 DIAGNOSIS — H04123 Dry eye syndrome of bilateral lacrimal glands: Secondary | ICD-10-CM | POA: Diagnosis not present

## 2018-03-04 DIAGNOSIS — H5203 Hypermetropia, bilateral: Secondary | ICD-10-CM | POA: Diagnosis not present

## 2018-05-04 DIAGNOSIS — M85852 Other specified disorders of bone density and structure, left thigh: Secondary | ICD-10-CM | POA: Diagnosis not present

## 2018-06-08 ENCOUNTER — Other Ambulatory Visit (HOSPITAL_COMMUNITY)
Admission: RE | Admit: 2018-06-08 | Discharge: 2018-06-08 | Disposition: A | Discharge status: Home / Self Care | Payer: BLUE CROSS/BLUE SHIELD | Source: Ambulatory Visit | Attending: Obstetrics and Gynecology | Admitting: Obstetrics and Gynecology

## 2018-06-08 ENCOUNTER — Other Ambulatory Visit: Payer: Self-pay | Admitting: Obstetrics and Gynecology

## 2018-06-08 DIAGNOSIS — N950 Postmenopausal bleeding: Secondary | ICD-10-CM | POA: Diagnosis not present

## 2018-06-08 DIAGNOSIS — Z01411 Encounter for gynecological examination (general) (routine) with abnormal findings: Secondary | ICD-10-CM | POA: Insufficient documentation

## 2018-06-08 DIAGNOSIS — N838 Other noninflammatory disorders of ovary, fallopian tube and broad ligament: Secondary | ICD-10-CM | POA: Diagnosis not present

## 2018-06-28 LAB — CYTOLOGY - PAP
Diagnosis: NEGATIVE
HPV 16/18/45 genotyping: NEGATIVE
HPV: DETECTED — AB

## 2018-08-09 DIAGNOSIS — N83202 Unspecified ovarian cyst, left side: Secondary | ICD-10-CM | POA: Diagnosis not present

## 2019-01-18 DIAGNOSIS — D2239 Melanocytic nevi of other parts of face: Secondary | ICD-10-CM | POA: Diagnosis not present

## 2019-01-18 DIAGNOSIS — L821 Other seborrheic keratosis: Secondary | ICD-10-CM | POA: Diagnosis not present

## 2019-01-18 DIAGNOSIS — L814 Other melanin hyperpigmentation: Secondary | ICD-10-CM | POA: Diagnosis not present

## 2019-01-18 DIAGNOSIS — Z85828 Personal history of other malignant neoplasm of skin: Secondary | ICD-10-CM | POA: Diagnosis not present

## 2019-01-18 DIAGNOSIS — D225 Melanocytic nevi of trunk: Secondary | ICD-10-CM | POA: Diagnosis not present

## 2019-01-18 DIAGNOSIS — D485 Neoplasm of uncertain behavior of skin: Secondary | ICD-10-CM | POA: Diagnosis not present

## 2019-01-19 DIAGNOSIS — E782 Mixed hyperlipidemia: Secondary | ICD-10-CM | POA: Diagnosis not present

## 2019-01-19 DIAGNOSIS — Z5181 Encounter for therapeutic drug level monitoring: Secondary | ICD-10-CM | POA: Diagnosis not present

## 2019-01-25 ENCOUNTER — Other Ambulatory Visit (HOSPITAL_COMMUNITY)
Admission: RE | Admit: 2019-01-25 | Discharge: 2019-01-25 | Disposition: A | Discharge status: Home / Self Care | Payer: BC Managed Care – PPO | Source: Ambulatory Visit | Attending: Obstetrics and Gynecology | Admitting: Obstetrics and Gynecology

## 2019-01-25 ENCOUNTER — Other Ambulatory Visit: Payer: Self-pay | Admitting: Obstetrics and Gynecology

## 2019-01-25 DIAGNOSIS — Z01411 Encounter for gynecological examination (general) (routine) with abnormal findings: Secondary | ICD-10-CM | POA: Diagnosis not present

## 2019-01-25 DIAGNOSIS — B977 Papillomavirus as the cause of diseases classified elsewhere: Secondary | ICD-10-CM | POA: Diagnosis not present

## 2019-01-25 DIAGNOSIS — Z01419 Encounter for gynecological examination (general) (routine) without abnormal findings: Secondary | ICD-10-CM | POA: Diagnosis not present

## 2019-01-25 DIAGNOSIS — Z124 Encounter for screening for malignant neoplasm of cervix: Secondary | ICD-10-CM | POA: Diagnosis not present

## 2019-01-25 DIAGNOSIS — N83202 Unspecified ovarian cyst, left side: Secondary | ICD-10-CM | POA: Diagnosis not present

## 2019-01-29 DIAGNOSIS — Z1231 Encounter for screening mammogram for malignant neoplasm of breast: Secondary | ICD-10-CM | POA: Diagnosis not present

## 2019-01-29 DIAGNOSIS — Z803 Family history of malignant neoplasm of breast: Secondary | ICD-10-CM | POA: Diagnosis not present

## 2019-01-29 LAB — CYTOLOGY - PAP
HPV 16/18/45 genotyping: NEGATIVE
HPV: DETECTED — AB

## 2019-02-17 ENCOUNTER — Other Ambulatory Visit: Payer: Self-pay | Admitting: Obstetrics and Gynecology

## 2019-02-17 DIAGNOSIS — N72 Inflammatory disease of cervix uteri: Secondary | ICD-10-CM | POA: Diagnosis not present

## 2019-02-17 DIAGNOSIS — N858 Other specified noninflammatory disorders of uterus: Secondary | ICD-10-CM | POA: Diagnosis not present

## 2019-02-17 DIAGNOSIS — N888 Other specified noninflammatory disorders of cervix uteri: Secondary | ICD-10-CM | POA: Diagnosis not present

## 2019-03-10 DIAGNOSIS — H5203 Hypermetropia, bilateral: Secondary | ICD-10-CM | POA: Diagnosis not present

## 2019-03-10 DIAGNOSIS — H2513 Age-related nuclear cataract, bilateral: Secondary | ICD-10-CM | POA: Diagnosis not present

## 2019-03-10 DIAGNOSIS — H04123 Dry eye syndrome of bilateral lacrimal glands: Secondary | ICD-10-CM | POA: Diagnosis not present

## 2019-03-29 DIAGNOSIS — M67371 Transient synovitis, right ankle and foot: Secondary | ICD-10-CM | POA: Diagnosis not present

## 2019-03-29 DIAGNOSIS — M19071 Primary osteoarthritis, right ankle and foot: Secondary | ICD-10-CM | POA: Diagnosis not present

## 2019-03-29 DIAGNOSIS — M76811 Anterior tibial syndrome, right leg: Secondary | ICD-10-CM | POA: Diagnosis not present

## 2019-03-29 DIAGNOSIS — M7751 Other enthesopathy of right foot: Secondary | ICD-10-CM | POA: Diagnosis not present

## 2019-04-06 DIAGNOSIS — M7751 Other enthesopathy of right foot: Secondary | ICD-10-CM | POA: Diagnosis not present

## 2019-04-06 DIAGNOSIS — M67371 Transient synovitis, right ankle and foot: Secondary | ICD-10-CM | POA: Diagnosis not present

## 2019-04-14 DIAGNOSIS — M7751 Other enthesopathy of right foot: Secondary | ICD-10-CM | POA: Diagnosis not present

## 2019-04-14 DIAGNOSIS — M67371 Transient synovitis, right ankle and foot: Secondary | ICD-10-CM | POA: Diagnosis not present

## 2019-04-14 DIAGNOSIS — M67471 Ganglion, right ankle and foot: Secondary | ICD-10-CM | POA: Diagnosis not present

## 2019-04-28 DIAGNOSIS — D179 Benign lipomatous neoplasm, unspecified: Secondary | ICD-10-CM | POA: Diagnosis not present

## 2019-04-28 DIAGNOSIS — M67371 Transient synovitis, right ankle and foot: Secondary | ICD-10-CM | POA: Diagnosis not present

## 2019-04-28 DIAGNOSIS — M7751 Other enthesopathy of right foot: Secondary | ICD-10-CM | POA: Diagnosis not present

## 2019-05-25 DIAGNOSIS — Z20828 Contact with and (suspected) exposure to other viral communicable diseases: Secondary | ICD-10-CM | POA: Diagnosis not present

## 2019-06-09 ENCOUNTER — Encounter (HOSPITAL_COMMUNITY): Payer: Self-pay | Admitting: Emergency Medicine

## 2019-06-09 ENCOUNTER — Emergency Department (HOSPITAL_COMMUNITY)
Admission: EM | Admit: 2019-06-09 | Discharge: 2019-06-09 | Disposition: A | Discharge status: Home / Self Care | Payer: BC Managed Care – PPO | Attending: Emergency Medicine | Admitting: Emergency Medicine

## 2019-06-09 DIAGNOSIS — R29810 Facial weakness: Secondary | ICD-10-CM | POA: Diagnosis not present

## 2019-06-09 DIAGNOSIS — Z79899 Other long term (current) drug therapy: Secondary | ICD-10-CM | POA: Diagnosis not present

## 2019-06-09 DIAGNOSIS — G510 Bell's palsy: Secondary | ICD-10-CM | POA: Insufficient documentation

## 2019-06-09 MED ORDER — PREDNISONE 20 MG PO TABS: 60.0000 mg | ORAL_TABLET | Freq: Every day | ORAL | 0 refills | Status: AC

## 2019-06-09 MED ORDER — VALACYCLOVIR HCL 500 MG PO TABS
1000.0000 mg | ORAL_TABLET | Freq: Once | ORAL | Status: AC
  Administered 2019-06-09: 1000 mg via ORAL
  Filled 2019-06-09: qty 2

## 2019-06-09 MED ORDER — PREDNISONE 20 MG PO TABS
60.0000 mg | ORAL_TABLET | Freq: Once | ORAL | Status: AC
  Administered 2019-06-09: 10:00:00 60 mg via ORAL
  Filled 2019-06-09: qty 3

## 2019-06-09 MED ORDER — VALACYCLOVIR HCL 1 G PO TABS: 1000.0000 mg | ORAL_TABLET | Freq: Three times a day (TID) | ORAL | 0 refills | Status: DC

## 2019-06-09 MED ORDER — HYPROMELLOSE (GONIOSCOPIC) 2.5 % OP SOLN: 1.0000 [drp] | Freq: Four times a day (QID) | OPHTHALMIC | 0 refills | Status: DC

## 2019-06-09 NOTE — ED Provider Notes (Signed)
  Face-to-face evaluation   History: Onset of left facial weakness, yesterday morning, with patient noticing abnormal taste in a "burned" feeling of the left anterior two thirds of her tongue.  No other symptoms.  Physical exam: Alert, calm and cooperative.  Left facial paralysis, mild, sparing forehead and platysma.  No dysarthria or aphasia.  She is lucid.  Normal gait.  Medical screening examination/treatment/procedure(s) were conducted as a shared visit with non-physician practitioner(s) and myself.  I personally evaluated the patient during the encounter    Daleen Bo, MD 06/10/19 [phone]

## 2019-06-09 NOTE — ED Provider Notes (Signed)
[facility] EMERGENCY DEPARTMENT Provider Note   CSN: [id number] Arrival date & time: 06/09/19  [id number]     History Chief Complaint  Patient presents with  . Facial Droop    Christine Austin is a 63 y.o. female with past medical history significant for hyperlipidemia presents to emergency department today with chief complaint of facial droop x24 hours.  Patient works as a Publishing rights manager yesterday at 68 AM coworker told her her left eye looked different than usual.  She has decreased blinking and increased tearing of left eye.  She states when she ate lunch later in the day she noticed the left side of her mouth was drooping and she was dropping food out of it.  She also reports she has decreased taste on the left side of her tongue but normal taste on the right side.    She denies any, chills, cough, headache, visual changes, weakness, numbness, tingling, chest pain, shortness of breath.  Plat No vesicles seen in ear, no signs of Ramsay Hunt on exam    Past Medical History:  Diagnosis Date  . Hyperlipidemia   . PONV (postoperative nausea and vomiting)   . SVD (spontaneous vaginal delivery)    x4    There are no problems to display for this patient.   Past Surgical History:  Procedure Laterality Date  . CERVICAL CONIZATION W/BX N/A 01/21/2018   Procedure: CONIZATION CERVIX WITH BIOPSY;  Surgeon: Thurnell Lose, MD;  Location: [facility];  Service: Gynecology;  Laterality: N/A;  . COLONOSCOPY    . conziation of cervix  11/2017  . DILATION AND CURETTAGE OF UTERUS     x 2   . RHINOPLASTY    . TONSILLECTOMY    . TONSILLECTOMY AND ADENOIDECTOMY    . TUBAL LIGATION    . WISDOM TOOTH EXTRACTION       OB History   No obstetric history on file.     No family history on file.  Social History   Tobacco Use  . Smoking status: Never Smoker  . Smokeless tobacco: Never Used  Substance Use Topics  . Alcohol use: Yes    Alcohol/week: 3.0 - 4.0  standard drinks    Types: 3 - 4 Standard drinks or equivalent per week    Comment: wine/mixed drinks  . Drug use: Never    Home Medications Prior to Admission medications   Medication Sig Start Date End Date Taking? Authorizing Provider  Calcium Carbonate-Vitamin D (CALCIUM-VITAMIN D3 PO) Take 2 tablets by mouth daily.    [provider]  hydroxypropyl methylcellulose / hypromellose (ISOPTO TEARS / GONIOVISC) 2.5 % ophthalmic solution Place 1 drop into the left eye 4 (four) times daily. 06/09/19   Chauntae Hults E, PA-C  ibuprofen (ADVIL,MOTRIN) 600 MG tablet Take 1 tablet (600 mg total) by mouth every 6 (six) hours as needed. 01/21/18   Thurnell Lose, MD  Multiple Vitamins-Minerals (MULTIVITAMIN PO) Take 1 tablet by mouth daily.    [provider]  pravastatin (PRAVACHOL) 20 MG tablet Take 20 mg by mouth every morning.    [provider]  predniSONE (DELTASONE) 20 MG tablet Take 3 tablets (60 mg total) by mouth daily for 5 days. 06/09/19 06/14/19  Raife Lizer, Harley Hallmark, PA-C  valACYclovir (VALTREX) 1000 MG tablet Take 1 tablet (1,000 mg total) by mouth 3 (three) times daily. 06/09/19   Novia Lansberry, Harley Hallmark, PA-C    Allergies    Patient has no known allergies.  Review of  Systems   Review of Systems All other systems are reviewed and are negative for acute change except as noted in the HPI.  Physical Exam Updated Vital Signs BP 125/85   Pulse 81   Temp 98.2 F (36.8 C) (Oral)   Resp 16   LMP  (LMP Unknown)   SpO2 99%   Physical Exam Vitals and nursing note reviewed.  Constitutional:      General: She is not in acute distress.    Appearance: She is not ill-appearing.  HENT:     Head: Normocephalic and atraumatic.     Right Ear: Tympanic membrane, ear canal and external ear normal.     Left Ear: Tympanic membrane, ear canal and external ear normal.     Ears:     Comments: No vesicles seen in ears.     Nose: Nose normal.     Mouth/Throat:      Mouth: Mucous membranes are moist.     Pharynx: Oropharynx is clear.  Eyes:     General: Lids are normal. Vision grossly intact. No visual field deficit or scleral icterus.       Right eye: No discharge.        Left eye: No discharge.     Extraocular Movements: Extraocular movements intact.     Conjunctiva/sclera: Conjunctivae normal.     Right eye: Right conjunctiva is not injected.     Left eye: Left conjunctiva is not injected.     Pupils: Pupils are equal, round, and reactive to light.     Visual Fields: Right eye visual fields normal and left eye visual fields normal.     Comments: Clear drainage from left eye. Decreased blinking with left eye.  Neck:     Vascular: No JVD.  Cardiovascular:     Rate and Rhythm: Normal rate and regular rhythm.     Pulses: Normal pulses.          Radial pulses are 2+ on the right side and 2+ on the left side.     Heart sounds: Normal heart sounds.  Pulmonary:     Comments: Lungs clear to auscultation in all fields. Symmetric chest rise. No wheezing, rales, or rhonchi. Abdominal:     Comments: Abdomen is soft, non-distended, and non-tender in all quadrants. No rigidity, no guarding. No peritoneal signs.  Musculoskeletal:        General: Normal range of motion.     Cervical back: Normal range of motion.  Skin:    General: Skin is warm and dry.     Capillary Refill: Capillary refill takes less than 2 seconds.  Neurological:     Mental Status: She is oriented to person, place, and time.     GCS: GCS eye subscore is 4. GCS verbal subscore is 5. GCS motor subscore is 6.     Comments: Mental Status:  Alert, oriented, thought content appropriate, able to give a coherent history. Speech fluent without evidence of aphasia. Able to follow 2 step commands without difficulty.  Cranial Nerves:  II:  Peripheral visual fields grossly normal, pupils equal, round, reactive to light III,IV, VI: ptosis not present, extra-ocular motions intact bilaterally  V,VII:  smile asymmetric, left side droop, facial light touch sensation equal VIII: hearing grossly normal to voice  X: uvula elevates symmetrically  XI: bilateral shoulder shrug symmetric and strong XII: midline tongue extension without fassiculations Motor:  Normal tone. 5/5 in upper and lower extremities bilaterally including strong and equal grip strength and  dorsiflexion/plantar flexion Sensory: Pinprick and light touch normal in all extremities.  Deep Tendon Reflexes: 2+ and symmetric in the biceps and patella Cerebellar: normal finger-to-nose with bilateral upper extremities Gait: normal gait and balance CV: distal pulses palpable throughout     Psychiatric:        Behavior: Behavior normal.       ED Results / Procedures / Treatments   Labs (all labs ordered are listed, but only abnormal results are displayed) Labs Reviewed - No data to display  EKG None  Radiology No results found.  Procedures Procedures (including critical care time)  Medications Ordered in ED Medications  predniSONE (DELTASONE) tablet 60 mg (60 mg Oral Given 06/09/19 1015)  valACYclovir (VALTREX) tablet 1,000 mg (1,000 mg Oral Given 06/09/19 1015)    ED Course  I have reviewed the triage vital signs and the nursing notes.  Pertinent labs & imaging results that were available during my care of the patient were reviewed by me and considered in my medical decision making (see chart for details).    MDM Rules/Calculators/A&P                      Patient seen and examined. She is presenting to ED > 24 hours since symptom onset. Patient nontoxic appearing, in no apparent distress, vitals WNL. On exam she has left eye with decreased blinking and clear drainage. She has mild left sided mouth droop without other neuro deficits. Forehead and platysma spared. She ambulates with steady gait. No vesicles in ears, no findings to suggest Ramsay Hunt syndrome. She is also describing loss of taste on anterior 2/3 of  left side of tongue. No visual changes. Do not feel imaging is indicated at this time as presentation and exam are consistent with Bells Palsy. Exam and vitals are reassuring. Will treat with steroids, valcyclovir, and artifical tears. Discussed taping eye at night to prevent unintentional injury. The patient was discussed with and seen by Dr. Eulis Foster who agrees with the treatment plan. The patient appears reasonably screened and/or stabilized for discharge and I doubt any other medical condition or other [facility] requiring further screening, evaluation, or treatment in the ED at this time prior to discharge. The patient is safe for discharge with strict return precautions discussed. Discussed at length symptoms concerning for stroke. Recommend neurology follow up if symptoms persist.   Portions of this note were generated with Dragon dictation software. Dictation errors may occur despite best attempts at proofreading.   Final Clinical Impression(s) / ED Diagnoses Final diagnoses:  Bell's palsy    Rx / DC Orders ED Discharge Orders         Ordered    predniSONE (DELTASONE) 20 MG tablet  Daily     06/09/19 1017    valACYclovir (VALTREX) 1000 MG tablet  3 times daily     06/09/19 1017    hydroxypropyl methylcellulose / hypromellose (ISOPTO TEARS / GONIOVISC) 2.5 % ophthalmic solution  4 times daily     06/09/19 1017           Abdalrahman Clementson, [place], PA-C 06/09/19 1957    Daleen Bo, MD 06/10/19 [phone]

## 2019-06-09 NOTE — ED Triage Notes (Signed)
Pt to ER for evaluation of facial droop onset yesterday while eating, reports initially had pain behind her left ear prior to noticing the facial paralysis. No weakness, drift, aphasia, or ataxia noted. shes a/o x4. Unable to close left eye lid. Hx of high cholesterol, on a statin. PCP sent to rule out stroke.

## 2019-06-09 NOTE — ED Notes (Signed)
Patient verbalizes understanding of discharge instructions. Opportunity for questioning and answers were provided. Armband removed by staff, pt discharged from ED.  

## 2019-06-09 NOTE — Discharge Instructions (Signed)
You have been seen today for facial drooping,  Your exam today is consistent with Bell's palsy.  This usually gets better in 1 week but can take longer in some people.  Please take the medications below as prescribed.   Please read and follow all provided instructions. Return to the emergency room for worsening condition or new concerning symptoms including weakness, numbness, headache, dizziness, changes in mental status.   1. Medications:  Prescription has been sent to your pharmacy for: -prednisone. This is a steroid, please take as prescribed -Valtrex.  This is an antiviral medication please take as prescribed -Artificial tears.  Please use this in your left eye as needed.  Continue usual home medications Take medications as prescribed. Please review all of the medicines and only take them if you do not have an allergy to them.   2. Treatment: rest, drink plenty of fluids -Tape your left eye before bed as we discussed to prevent scratching and injury to your eye since you are unable to close it as you usually do. -Do not wear contacts until your left eye returns to normal function.  3. Follow Up:  Please follow up with primary care provider by scheduling an appointment as soon as possible for a visit  -Also recommend you follow-up with neurology if your symptoms persist.   It is also a possibility that you have an allergic reaction to any of the medicines that you have been prescribed - Everybody reacts differently to medications and while MOST people have no trouble with most medicines, you may have a reaction such as nausea, vomiting, rash, swelling, shortness of breath. If this is the case, please stop taking the medicine immediately and contact your physician.  ?

## 2019-06-10 DIAGNOSIS — Z20828 Contact with and (suspected) exposure to other viral communicable diseases: Secondary | ICD-10-CM | POA: Diagnosis not present

## 2019-06-10 DIAGNOSIS — Z9189 Other specified personal risk factors, not elsewhere classified: Secondary | ICD-10-CM | POA: Diagnosis not present

## 2019-06-14 ENCOUNTER — Ambulatory Visit: Payer: BC Managed Care – PPO | Attending: Internal Medicine

## 2019-06-14 DIAGNOSIS — Z20828 Contact with and (suspected) exposure to other viral communicable diseases: Secondary | ICD-10-CM | POA: Diagnosis not present

## 2019-06-14 DIAGNOSIS — Z20822 Contact with and (suspected) exposure to covid-19: Secondary | ICD-10-CM

## 2019-06-16 LAB — NOVEL CORONAVIRUS, NAA: SARS-CoV-2, NAA: NOT DETECTED

## 2019-06-19 DIAGNOSIS — G510 Bell's palsy: Secondary | ICD-10-CM | POA: Diagnosis not present

## 2019-06-21 DIAGNOSIS — G510 Bell's palsy: Secondary | ICD-10-CM | POA: Diagnosis not present

## 2019-07-23 DIAGNOSIS — H938X2 Other specified disorders of left ear: Secondary | ICD-10-CM | POA: Diagnosis not present

## 2019-07-23 DIAGNOSIS — H6982 Other specified disorders of Eustachian tube, left ear: Secondary | ICD-10-CM | POA: Diagnosis not present

## 2019-07-23 DIAGNOSIS — H9312 Tinnitus, left ear: Secondary | ICD-10-CM | POA: Diagnosis not present

## 2019-07-27 DIAGNOSIS — N839 Noninflammatory disorder of ovary, fallopian tube and broad ligament, unspecified: Secondary | ICD-10-CM | POA: Diagnosis not present

## 2019-08-08 DIAGNOSIS — N9489 Other specified conditions associated with female genital organs and menstrual cycle: Secondary | ICD-10-CM | POA: Diagnosis not present

## 2019-08-12 DIAGNOSIS — H6983 Other specified disorders of Eustachian tube, bilateral: Secondary | ICD-10-CM | POA: Diagnosis not present

## 2019-08-12 DIAGNOSIS — H9312 Tinnitus, left ear: Secondary | ICD-10-CM | POA: Diagnosis not present

## 2019-08-12 DIAGNOSIS — H903 Sensorineural hearing loss, bilateral: Secondary | ICD-10-CM | POA: Diagnosis not present

## 2019-08-20 ENCOUNTER — Other Ambulatory Visit: Payer: Self-pay

## 2019-08-20 ENCOUNTER — Ambulatory Visit: Payer: BC Managed Care – PPO | Attending: Internal Medicine

## 2019-08-20 DIAGNOSIS — Z23 Encounter for immunization: Secondary | ICD-10-CM | POA: Insufficient documentation

## 2019-08-20 NOTE — Progress Notes (Signed)
   Covid-19 Vaccination Clinic  Name:  Christine Austin    MRN: [id number] DOB: [birth year]  08/20/2019  [name] was observed post Covid-19 immunization for 15 minutes without incidence. She was provided with Vaccine Information Sheet and instruction to access the V-Safe system.   [name] was instructed to call 911 with any severe reactions post vaccine: Marland Kitchen Difficulty breathing  . Swelling of your face and throat  . A fast heartbeat  . A bad rash all over your body  . Dizziness and weakness    Immunizations Administered    Name Date Dose VIS Date Route   Pfizer COVID-19 Vaccine 08/20/2019  9:41 AM 0.3 mL 06/03/2019 Intramuscular   Manufacturer: [place]   Lot: [id number]   [place]: [id number]

## 2019-09-10 ENCOUNTER — Ambulatory Visit: Payer: BC Managed Care – PPO | Attending: Internal Medicine

## 2019-09-10 DIAGNOSIS — Z23 Encounter for immunization: Secondary | ICD-10-CM

## 2019-09-10 NOTE — Progress Notes (Signed)
   Covid-19 Vaccination Clinic  Name:  Christine Austin    MRN: [id number] DOB: 30-Aug-1955  09/10/2019  [name] was observed post Covid-19 immunization for 15 minutes without incident. She was provided with Vaccine Information Sheet and instruction to access the V-Safe system.   [name] was instructed to call 911 with any severe reactions post vaccine: Marland Kitchen Difficulty breathing  . Swelling of face and throat  . A fast heartbeat  . A bad rash all over body  . Dizziness and weakness   Immunizations Administered    Name Date Dose VIS Date Route   Pfizer COVID-19 Vaccine 09/10/2019 10:53 AM 0.3 mL 06/03/2019 Intramuscular   Manufacturer: [place]   Lot: [id number]   [place]: [id number]

## 2019-09-12 ENCOUNTER — Ambulatory Visit: Payer: BC Managed Care – PPO

## 2019-09-13 ENCOUNTER — Ambulatory Visit: Payer: BC Managed Care – PPO

## 2019-09-21 DIAGNOSIS — L304 Erythema intertrigo: Secondary | ICD-10-CM | POA: Diagnosis not present

## 2019-09-21 DIAGNOSIS — B354 Tinea corporis: Secondary | ICD-10-CM | POA: Diagnosis not present

## 2019-09-28 DIAGNOSIS — M79671 Pain in right foot: Secondary | ICD-10-CM | POA: Diagnosis not present

## 2019-09-28 DIAGNOSIS — M25871 Other specified joint disorders, right ankle and foot: Secondary | ICD-10-CM | POA: Diagnosis not present

## 2019-09-28 DIAGNOSIS — M25571 Pain in right ankle and joints of right foot: Secondary | ICD-10-CM | POA: Diagnosis not present

## 2019-09-29 DIAGNOSIS — H9312 Tinnitus, left ear: Secondary | ICD-10-CM | POA: Diagnosis not present

## 2019-09-29 DIAGNOSIS — H903 Sensorineural hearing loss, bilateral: Secondary | ICD-10-CM | POA: Diagnosis not present

## 2019-10-04 DIAGNOSIS — L986 Other infiltrative disorders of the skin and subcutaneous tissue: Secondary | ICD-10-CM | POA: Diagnosis not present

## 2019-10-04 DIAGNOSIS — L820 Inflamed seborrheic keratosis: Secondary | ICD-10-CM | POA: Diagnosis not present

## 2019-10-04 DIAGNOSIS — L920 Granuloma annulare: Secondary | ICD-10-CM | POA: Diagnosis not present

## 2019-10-04 DIAGNOSIS — D485 Neoplasm of uncertain behavior of skin: Secondary | ICD-10-CM | POA: Diagnosis not present

## 2019-10-04 DIAGNOSIS — L821 Other seborrheic keratosis: Secondary | ICD-10-CM | POA: Diagnosis not present

## 2019-10-10 DIAGNOSIS — M25871 Other specified joint disorders, right ankle and foot: Secondary | ICD-10-CM | POA: Diagnosis not present

## 2019-10-17 DIAGNOSIS — R2241 Localized swelling, mass and lump, right lower limb: Secondary | ICD-10-CM | POA: Diagnosis not present

## 2020-01-11 DIAGNOSIS — E782 Mixed hyperlipidemia: Secondary | ICD-10-CM | POA: Diagnosis not present

## 2020-01-11 DIAGNOSIS — Z8349 Family history of other endocrine, nutritional and metabolic diseases: Secondary | ICD-10-CM | POA: Diagnosis not present

## 2020-01-11 DIAGNOSIS — E559 Vitamin D deficiency, unspecified: Secondary | ICD-10-CM | POA: Diagnosis not present

## 2020-01-11 DIAGNOSIS — Z0000 Encounter for general adult medical examination without abnormal findings: Secondary | ICD-10-CM | POA: Diagnosis not present

## 2020-01-26 DIAGNOSIS — D485 Neoplasm of uncertain behavior of skin: Secondary | ICD-10-CM | POA: Diagnosis not present

## 2020-01-26 DIAGNOSIS — L814 Other melanin hyperpigmentation: Secondary | ICD-10-CM | POA: Diagnosis not present

## 2020-01-26 DIAGNOSIS — D225 Melanocytic nevi of trunk: Secondary | ICD-10-CM | POA: Diagnosis not present

## 2020-01-26 DIAGNOSIS — L570 Actinic keratosis: Secondary | ICD-10-CM | POA: Diagnosis not present

## 2020-01-26 DIAGNOSIS — L821 Other seborrheic keratosis: Secondary | ICD-10-CM | POA: Diagnosis not present

## 2020-01-26 DIAGNOSIS — Z85828 Personal history of other malignant neoplasm of skin: Secondary | ICD-10-CM | POA: Diagnosis not present

## 2020-01-30 DIAGNOSIS — R87611 Atypical squamous cells cannot exclude high grade squamous intraepithelial lesion on cytologic smear of cervix (ASC-H): Secondary | ICD-10-CM | POA: Diagnosis not present

## 2020-01-30 DIAGNOSIS — Z1231 Encounter for screening mammogram for malignant neoplasm of breast: Secondary | ICD-10-CM | POA: Diagnosis not present

## 2020-01-30 DIAGNOSIS — N839 Noninflammatory disorder of ovary, fallopian tube and broad ligament, unspecified: Secondary | ICD-10-CM | POA: Diagnosis not present

## 2020-01-30 DIAGNOSIS — R87810 Cervical high risk human papillomavirus (HPV) DNA test positive: Secondary | ICD-10-CM | POA: Diagnosis not present

## 2020-01-30 DIAGNOSIS — Z01411 Encounter for gynecological examination (general) (routine) with abnormal findings: Secondary | ICD-10-CM | POA: Diagnosis not present

## 2020-01-30 DIAGNOSIS — R197 Diarrhea, unspecified: Secondary | ICD-10-CM | POA: Diagnosis not present

## 2020-02-09 ENCOUNTER — Telehealth: Payer: Self-pay | Admitting: *Deleted

## 2020-02-09 NOTE — Telephone Encounter (Addendum)
Called and gave the patient the appt date/time for her new patient appt 10/13 at 10:30 am. Patient was driving, explained that I would send her a my chart message with all her information. Per Dr Simona Huh and Dr Berline Lopes patient to be scheduled six week out.

## 2020-02-13 DIAGNOSIS — Z0000 Encounter for general adult medical examination without abnormal findings: Secondary | ICD-10-CM | POA: Diagnosis not present

## 2020-02-15 DIAGNOSIS — Z1159 Encounter for screening for other viral diseases: Secondary | ICD-10-CM | POA: Diagnosis not present

## 2020-02-20 DIAGNOSIS — Z86010 Personal history of colonic polyps: Secondary | ICD-10-CM | POA: Diagnosis not present

## 2020-02-20 DIAGNOSIS — K635 Polyp of colon: Secondary | ICD-10-CM | POA: Diagnosis not present

## 2020-02-20 DIAGNOSIS — K5730 Diverticulosis of large intestine without perforation or abscess without bleeding: Secondary | ICD-10-CM | POA: Diagnosis not present

## 2020-02-29 DIAGNOSIS — H2513 Age-related nuclear cataract, bilateral: Secondary | ICD-10-CM | POA: Diagnosis not present

## 2020-02-29 DIAGNOSIS — H04123 Dry eye syndrome of bilateral lacrimal glands: Secondary | ICD-10-CM | POA: Diagnosis not present

## 2020-02-29 DIAGNOSIS — G510 Bell's palsy: Secondary | ICD-10-CM | POA: Diagnosis not present

## 2020-03-30 ENCOUNTER — Telehealth: Payer: Self-pay | Admitting: *Deleted

## 2020-03-30 NOTE — Telephone Encounter (Addendum)
Called the patient and moved her appt from 10/13 with Dr Berline Lopes to 10/14 with Dr Denman George. Explained that Dr Berline Lopes injured her leg and will be out of the office for a couple of weeks. Called referring office and left a message to call the office back     3:54 pm  Called Dr Andy Gauss office and left a message that Dr Berline Lopes injured her leg and will out of the office for several weeks. Explained that even through the patient was original scheduled with Dr Berline Lopes per Dr Andy Gauss request; that patient has be moved to Dr Denman George

## 2020-04-04 ENCOUNTER — Ambulatory Visit: Payer: BC Managed Care – PPO | Admitting: Gynecologic Oncology

## 2020-04-05 ENCOUNTER — Encounter: Payer: Self-pay | Admitting: Gynecologic Oncology

## 2020-04-05 ENCOUNTER — Other Ambulatory Visit: Payer: Self-pay

## 2020-04-05 ENCOUNTER — Inpatient Hospital Stay: Payer: BC Managed Care – PPO | Attending: Gynecologic Oncology | Admitting: Gynecologic Oncology

## 2020-04-05 VITALS — BP 127/77 | HR 88 | Temp 98.9°F | Resp 18 | Ht 66.0 in | Wt 174.0 lb

## 2020-04-05 DIAGNOSIS — N879 Dysplasia of cervix uteri, unspecified: Secondary | ICD-10-CM | POA: Diagnosis not present

## 2020-04-05 DIAGNOSIS — Z800 Family history of malignant neoplasm of digestive organs: Secondary | ICD-10-CM | POA: Insufficient documentation

## 2020-04-05 DIAGNOSIS — R87810 Cervical high risk human papillomavirus (HPV) DNA test positive: Secondary | ICD-10-CM | POA: Diagnosis not present

## 2020-04-05 DIAGNOSIS — D3912 Neoplasm of uncertain behavior of left ovary: Secondary | ICD-10-CM

## 2020-04-05 DIAGNOSIS — Z8042 Family history of malignant neoplasm of prostate: Secondary | ICD-10-CM | POA: Diagnosis not present

## 2020-04-05 DIAGNOSIS — E7800 Pure hypercholesterolemia, unspecified: Secondary | ICD-10-CM | POA: Insufficient documentation

## 2020-04-05 DIAGNOSIS — N838 Other noninflammatory disorders of ovary, fallopian tube and broad ligament: Secondary | ICD-10-CM | POA: Insufficient documentation

## 2020-04-05 DIAGNOSIS — R87611 Atypical squamous cells cannot exclude high grade squamous intraepithelial lesion on cytologic smear of cervix (ASC-H): Secondary | ICD-10-CM | POA: Insufficient documentation

## 2020-04-05 DIAGNOSIS — Z79899 Other long term (current) drug therapy: Secondary | ICD-10-CM | POA: Diagnosis not present

## 2020-04-05 DIAGNOSIS — R87613 High grade squamous intraepithelial lesion on cytologic smear of cervix (HGSIL): Secondary | ICD-10-CM | POA: Insufficient documentation

## 2020-04-05 NOTE — H&P (View-Only) (Signed)
Consult Note: Gyn-Onc  Consult was requested by Dr. Barkley Boards for the evaluation of Christine Austin 64 y.o. female  CC:  Chief Complaint  Patient presents with  . Ovarian mass  . Cervical dysplasia    Assessment/Plan:  Christine Austin  is a 64 y.o.  year old with a longstanding history of cervical dysplasia and a Pap showing ASC-H with high risk HPV. She has stable left ovarian masses.   The patient does not have adequate ectocervix for colposcopic evaluation or excisional procedure.  Therefore I am recommending hysterectomy for diagnostic purposes.  I explained to Christine Austin that hysterectomy in the setting of positive high-risk HPV disease and cervical dysplasia or is not curative for the underlying disease process.  She will continue to likely have abnormal Paps of the vagina, which may require interventions and can develop into vaginal cancer potentially vulvar cancer.  Therefore she will continue to need close surveillance with gynecologic exams at least annually pending the results of her Paps.  I have a very low suspicion for occult cancer based on my physical examination.  I explained to the patient there is a small risk for performing an extrafascial hysterectomy in the setting of malignancy which would be an adequate if a malignant diagnosis was present.  This might necessitate postoperative radiation to achieve better margins.  I explained that her hysterectomy is high risk due to the lack of cervical vaginal definition.  We will need to use EEA sizers intraoperatively to define the cervicovaginal junction.  This places her at increased risk for bladder or rectal injury at the time of surgery and explained to the patient.  However overall her risks of surgical complication alone.  I explained surgical risks including  bleeding, infection, damage to internal organs (such as bladder,ureters, bowels), blood clot, reoperation and rehospitalization.  She is scheduled for a  robotic assisted total hysterectomy with BSO.  This can be likely accomplished at the surgery center.  She is retired and therefore does not need FMLA paperwork.  I provided her with an overview of anticipated postoperative recovery and course, however she will return for a more thorough preoperative evaluation with Christine Austin prior to surgery.   HPI: Christine Austin is a 64 year old P4 who was seen in consultation at the request of Dr Barkley Boards for evaluation of ASC-H pap in the setting of high risk HPV disease and inadequate cervical tissue for excisional biopsy.  The patient has had a 7-year history of abnormal Pap tests which culminated in a cone biopsy being performed in 2019 for a preoperative diagnosis of ASCUS positive high-risk HPV.  CIN 2-3 was seen on colposcopic biopsies.  The cone biopsy specimen was free of dysplasia or malignancy.  Following that she had persistent high risk HPV positive Paps but only atypical cells or low-grade cells on cytology.  In 2020 she had an ASCUS high-risk HPV Pap with negative HPV for 16 and 18 that was treated with colposcopic biopsies that showed only atypia but no dysplasia or malignancy.  She return for Pap test in August 2021 and on August 06/11/2020 she had a Pap which was positive for atypical squamous cells cannot rule out high-grade, positive for high risk HPV.  Her gynecologist felt that her cervical exam revealed insufficient cervix for colposcopy or excisional procedure and therefore she was referred to GYN oncology.  Of note the patient has also been followed for an intrauterine mass and left ovarian masses both very small  in size.  An ultrasound on January 30, 2020 revealed a uterus with no abnormality seen, the endometrium thickened with a hyperechoic mass that was less than 1 cm in the fundal wall with no blood flow.  This was stable from February 2021.  Her right ovary was normal and the left ovary contained 2 hyperechoic masses 1 measuring  1.1 cm the other measuring 0.7 cm both stable from February 2021 and avascular.  Her medical history is most significant for hypercholesterolemia, history of a Bell's palsy.  Her surgical history is most significant for D&C, tubal ligation.  Her gynecologic history is remarkable for D&Cs for endoemtrial polyps, abnormal paps, cone biopsy 2019, high risk  HPV positive, SVD x 4.  Her family cancer history is significant for a sister with colon cancer in her 62's (negative genetic testing), brother with prostate cancer.  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   Current Meds:  Outpatient Encounter Medications as of 04/05/2020  Medication Sig  . Calcium Carbonate-Vitamin D (CALCIUM-VITAMIN D3 PO) Take 2 tablets by mouth daily.  . Multiple Vitamins-Minerals (MULTIVITAMIN PO) Take 1 tablet by mouth daily.  . pravastatin (PRAVACHOL) 20 MG tablet Take 20 mg by mouth every morning.  Marland Kitchen PREMARIN vaginal cream Place 0.5 g vaginally 2 (two) times a week.  . [DISCONTINUED] hydroxypropyl methylcellulose / hypromellose (ISOPTO TEARS / GONIOVISC) 2.5 % ophthalmic solution Place 1 drop into the left eye 4 (four) times daily.  . [DISCONTINUED] ibuprofen (ADVIL,MOTRIN) 600 MG tablet Take 1 tablet (600 mg total) by mouth every 6 (six) hours as needed.  . [DISCONTINUED] valACYclovir (VALTREX) 1000 MG tablet Take 1 tablet (1,000 mg total) by mouth 3 (three) times daily.   No facility-administered encounter medications on file as of 04/05/2020.    Allergy: No Known Allergies  Social Hx:   Social History   Socioeconomic History  . Marital status: Married    Spouse name: Not on file  . Number of children: Not on file  . Years of education: Not on file  . Highest education level: Not on file  Occupational History  . Not on file  Tobacco Use  . Smoking status: Never Smoker  . Smokeless tobacco: Never Used  Vaping Use  . Vaping Use: Never  used  Substance and Sexual Activity  . Alcohol use: Yes    Alcohol/week: 3.0 - 4.0 standard drinks    Types: 3 - 4 Standard drinks or equivalent per week    Comment: wine/mixed drinks  . Drug use: Never  . Sexual activity: Not on file  Other Topics Concern  . Not on file  Social History Narrative  . Not on file   Social Determinants of Health   Financial Resource Strain:   . Difficulty of Paying Living Expenses: Not on file  Food Insecurity:   . Worried About Charity fundraiser in the Last Year: Not on file  . Ran Out of Food in the Last Year: Not on file  Transportation Needs:   . Lack of Transportation (Medical): Not on file  . Lack of Transportation (Non-Medical): Not on file  Physical Activity:   . Days of Exercise per Week: Not on file  . Minutes of Exercise per Session: Not on file  Stress:   . Feeling of Stress : Not on file  Social Connections:   . Frequency of Communication with Friends and Family: Not on file  .  Frequency of Social Gatherings with Friends and Family: Not on file  . Attends Religious Services: Not on file  . Active Member of Clubs or Organizations: Not on file  . Attends Archivist Meetings: Not on file  . Marital Status: Not on file  Intimate Partner Violence:   . Fear of Current or Ex-Partner: Not on file  . Emotionally Abused: Not on file  . Physically Abused: Not on file  . Sexually Abused: Not on file    Past Surgical Hx:  Past Surgical History:  Procedure Laterality Date  . CERVICAL CONIZATION W/BX N/A 01/21/2018   Procedure: CONIZATION CERVIX WITH BIOPSY;  Surgeon: Thurnell Lose, MD;  Location: [facility];  Service: Gynecology;  Laterality: N/A;  . COLONOSCOPY    . conziation of cervix  11/2017  . DILATION AND CURETTAGE OF UTERUS     x 2   . RHINOPLASTY    . TONSILLECTOMY    . TONSILLECTOMY AND ADENOIDECTOMY    . TUBAL LIGATION    . WISDOM TOOTH EXTRACTION      Past Medical Hx:  Past Medical History:  Diagnosis Date   . Hyperlipidemia   . PONV (postoperative nausea and vomiting)   . SVD (spontaneous vaginal delivery)    x4    Past Gynecological History:  +HPV, dysplasia, abnormal uterine bleeding (remote) No LMP recorded (lmp unknown). Patient is postmenopausal.  Family Hx:  Family History  Problem Relation Age of Onset  . Heart attack Mother   . Cancer Father 25       liver (nonalcoholic)  . Cancer Sister 23       colon  . Cancer Brother 60       prostate    Review of Systems:  Constitutional  Feels well,    ENT Normal appearing ears and nares bilaterally Skin/Breast  No rash, sores, jaundice, itching, dryness Cardiovascular  No chest pain, shortness of breath, or edema  Pulmonary  No cough or wheeze.  Gastro Intestinal  No nausea, vomitting, or diarrhoea. No bright red blood per rectum, no abdominal pain, change in bowel movement, or constipation.  Genito Urinary  No frequency, urgency, dysuria, no bleeding Musculo Skeletal  No myalgia, arthralgia, joint swelling or pain  Neurologic  No weakness, numbness, change in gait,  Psychology  No depression, anxiety, insomnia.   Vitals:  Blood pressure 127/77, pulse 88, temperature 98.9 F (37.2 C), temperature source Tympanic, resp. rate 18, height 5\' 6"  (1.676 m), weight 174 lb (78.9 kg), SpO2 98 %.  Physical Exam: WD in NAD Neck  Supple NROM, without any enlargements.  Lymph Node Survey No cervical supraclavicular or inguinal adenopathy Cardiovascular  Pulse normal rate, regularity and rhythm. S1 and S2 normal.  Lungs  Clear to auscultation bilateraly, without wheezes/crackles/rhonchi. Good air movement.  Skin  No rash/lesions/breakdown  Psychiatry  Alert and oriented to person, place, and time  Abdomen  Normoactive bowel sounds, abdomen soft, non-tender and nonobese without evidence of hernia.  Back No CVA tenderness Genito Urinary  Vulva/vagina: Normal external female genitalia.   No lesions. No discharge or  bleeding.  Bladder/urethra:  No lesions or masses, well supported bladder  Vagina: no lesions  Cervix: pinpoint os flush with upper vagina. No palpable or visible cervical stroma or ectocervix.   Uterus:  Small, mobile, no parametrial involvement or nodularity.  Adnexa: no palpable masses. Rectal  deferred  Extremities  No bilateral cyanosis, clubbing or edema.  60 minutes of total time was spent for  this patient encounter, including preparation, face-to-face counseling with the patient and coordination of care, review of imaging (results and images), communication with the referring provider and documentation of the encounter.   [name], MD  04/05/2020, 10:12 AM DD: Dr Florene Route

## 2020-04-05 NOTE — Progress Notes (Signed)
Consult Note: Gyn-Onc  Consult was requested by Dr. Barkley Boards for the evaluation of Christine Austin 64 y.o. female  CC:  Chief Complaint  Patient presents with  . Ovarian mass  . Cervical dysplasia    Assessment/Plan:  Christine Austin  is a 64 y.o.  year old with a longstanding history of cervical dysplasia and a Pap showing ASC-H with high risk HPV. She has stable left ovarian masses.   The patient does not have adequate ectocervix for colposcopic evaluation or excisional procedure.  Therefore I am recommending hysterectomy for diagnostic purposes.  I explained to Christine Austin that hysterectomy in the setting of positive high-risk HPV disease and cervical dysplasia or is not curative for the underlying disease process.  She will continue to likely have abnormal Paps of the vagina, which may require interventions and can develop into vaginal cancer potentially vulvar cancer.  Therefore she will continue to need close surveillance with gynecologic exams at least annually pending the results of her Paps.  I have a very low suspicion for occult cancer based on my physical examination.  I explained to the patient there is a small risk for performing an extrafascial hysterectomy in the setting of malignancy which would be an adequate if a malignant diagnosis was present.  This might necessitate postoperative radiation to achieve better margins.  I explained that her hysterectomy is high risk due to the lack of cervical vaginal definition.  We will need to use EEA sizers intraoperatively to define the cervicovaginal junction.  This places her at increased risk for bladder or rectal injury at the time of surgery and explained to the patient.  However overall her risks of surgical complication alone.  I explained surgical risks including  bleeding, infection, damage to internal organs (such as bladder,ureters, bowels), blood clot, reoperation and rehospitalization.  She is scheduled for a  robotic assisted total hysterectomy with BSO.  This can be likely accomplished at the surgery center.  She is retired and therefore does not need FMLA paperwork.  I provided her with an overview of anticipated postoperative recovery and course, however she will return for a more thorough preoperative evaluation with Joylene John prior to surgery.   HPI: Christine Austin is a 64 year old P4 who was seen in consultation at the request of Dr Barkley Boards for evaluation of ASC-H pap in the setting of high risk HPV disease and inadequate cervical tissue for excisional biopsy.  The patient has had a 7-year history of abnormal Pap tests which culminated in a cone biopsy being performed in 2019 for a preoperative diagnosis of ASCUS positive high-risk HPV.  CIN 2-3 was seen on colposcopic biopsies.  The cone biopsy specimen was free of dysplasia or malignancy.  Following that she had persistent high risk HPV positive Paps but only atypical cells or low-grade cells on cytology.  In 2020 she had an ASCUS high-risk HPV Pap with negative HPV for 16 and 18 that was treated with colposcopic biopsies that showed only atypia but no dysplasia or malignancy.  She return for Pap test in August 2021 and on August 06/11/2020 she had a Pap which was positive for atypical squamous cells cannot rule out high-grade, positive for high risk HPV.  Her gynecologist felt that her cervical exam revealed insufficient cervix for colposcopy or excisional procedure and therefore she was referred to GYN oncology.  Of note the patient has also been followed for an intrauterine mass and left ovarian masses both very small  in size.  An ultrasound on January 30, 2020 revealed a uterus with no abnormality seen, the endometrium thickened with a hyperechoic mass that was less than 1 cm in the fundal wall with no blood flow.  This was stable from February 2021.  Her right ovary was normal and the left ovary contained 2 hyperechoic masses 1 measuring  1.1 cm the other measuring 0.7 cm both stable from February 2021 and avascular.  Her medical history is most significant for hypercholesterolemia, history of a Bell's palsy.  Her surgical history is most significant for D&C, tubal ligation.  Her gynecologic history is remarkable for D&Cs for endoemtrial polyps, abnormal paps, cone biopsy 2019, high risk  HPV positive, SVD x 4.  Her family cancer history is significant for a sister with colon cancer in her 44's (negative genetic testing), brother with prostate cancer.  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   Current Meds:  Outpatient Encounter Medications as of 04/05/2020  Medication Sig  . Calcium Carbonate-Vitamin D (CALCIUM-VITAMIN D3 PO) Take 2 tablets by mouth daily.  . Multiple Vitamins-Minerals (MULTIVITAMIN PO) Take 1 tablet by mouth daily.  . pravastatin (PRAVACHOL) 20 MG tablet Take 20 mg by mouth every morning.  Marland Kitchen PREMARIN vaginal cream Place 0.5 g vaginally 2 (two) times a week.  . [DISCONTINUED] hydroxypropyl methylcellulose / hypromellose (ISOPTO TEARS / GONIOVISC) 2.5 % ophthalmic solution Place 1 drop into the left eye 4 (four) times daily.  . [DISCONTINUED] ibuprofen (ADVIL,MOTRIN) 600 MG tablet Take 1 tablet (600 mg total) by mouth every 6 (six) hours as needed.  . [DISCONTINUED] valACYclovir (VALTREX) 1000 MG tablet Take 1 tablet (1,000 mg total) by mouth 3 (three) times daily.   No facility-administered encounter medications on file as of 04/05/2020.    Allergy: No Known Allergies  Social Hx:   Social History   Socioeconomic History  . Marital status: Married    Spouse name: Not on file  . Number of children: Not on file  . Years of education: Not on file  . Highest education level: Not on file  Occupational History  . Not on file  Tobacco Use  . Smoking status: Never Smoker  . Smokeless tobacco: Never Used  Vaping Use  . Vaping Use: Never  used  Substance and Sexual Activity  . Alcohol use: Yes    Alcohol/week: 3.0 - 4.0 standard drinks    Types: 3 - 4 Standard drinks or equivalent per week    Comment: wine/mixed drinks  . Drug use: Never  . Sexual activity: Not on file  Other Topics Concern  . Not on file  Social History Narrative  . Not on file   Social Determinants of Health   Financial Resource Strain:   . Difficulty of Paying Living Expenses: Not on file  Food Insecurity:   . Worried About Charity fundraiser in the Last Year: Not on file  . Ran Out of Food in the Last Year: Not on file  Transportation Needs:   . Lack of Transportation (Medical): Not on file  . Lack of Transportation (Non-Medical): Not on file  Physical Activity:   . Days of Exercise per Week: Not on file  . Minutes of Exercise per Session: Not on file  Stress:   . Feeling of Stress : Not on file  Social Connections:   . Frequency of Communication with Friends and Family: Not on file  .  Frequency of Social Gatherings with Friends and Family: Not on file  . Attends Religious Services: Not on file  . Active Member of Clubs or Organizations: Not on file  . Attends Archivist Meetings: Not on file  . Marital Status: Not on file  Intimate Partner Violence:   . Fear of Current or Ex-Partner: Not on file  . Emotionally Abused: Not on file  . Physically Abused: Not on file  . Sexually Abused: Not on file    Past Surgical Hx:  Past Surgical History:  Procedure Laterality Date  . CERVICAL CONIZATION W/BX N/A 01/21/2018   Procedure: CONIZATION CERVIX WITH BIOPSY;  Surgeon: Thurnell Lose, MD;  Location: [facility];  Service: Gynecology;  Laterality: N/A;  . COLONOSCOPY    . conziation of cervix  11/2017  . DILATION AND CURETTAGE OF UTERUS     x 2   . RHINOPLASTY    . TONSILLECTOMY    . TONSILLECTOMY AND ADENOIDECTOMY    . TUBAL LIGATION    . WISDOM TOOTH EXTRACTION      Past Medical Hx:  Past Medical History:  Diagnosis Date   . Hyperlipidemia   . PONV (postoperative nausea and vomiting)   . SVD (spontaneous vaginal delivery)    x4    Past Gynecological History:  +HPV, dysplasia, abnormal uterine bleeding (remote) No LMP recorded (lmp unknown). Patient is postmenopausal.  Family Hx:  Family History  Problem Relation Age of Onset  . Heart attack Mother   . Cancer Father 39       liver (nonalcoholic)  . Cancer Sister 47       colon  . Cancer Brother 55       prostate    Review of Systems:  Constitutional  Feels well,    ENT Normal appearing ears and nares bilaterally Skin/Breast  No rash, sores, jaundice, itching, dryness Cardiovascular  No chest pain, shortness of breath, or edema  Pulmonary  No cough or wheeze.  Gastro Intestinal  No nausea, vomitting, or diarrhoea. No bright red blood per rectum, no abdominal pain, change in bowel movement, or constipation.  Genito Urinary  No frequency, urgency, dysuria, no bleeding Musculo Skeletal  No myalgia, arthralgia, joint swelling or pain  Neurologic  No weakness, numbness, change in gait,  Psychology  No depression, anxiety, insomnia.   Vitals:  Blood pressure 127/77, pulse 88, temperature 98.9 F (37.2 C), temperature source Tympanic, resp. rate 18, height 5\' 6"  (1.676 m), weight 174 lb (78.9 kg), SpO2 98 %.  Physical Exam: WD in NAD Neck  Supple NROM, without any enlargements.  Lymph Node Survey No cervical supraclavicular or inguinal adenopathy Cardiovascular  Pulse normal rate, regularity and rhythm. S1 and S2 normal.  Lungs  Clear to auscultation bilateraly, without wheezes/crackles/rhonchi. Good air movement.  Skin  No rash/lesions/breakdown  Psychiatry  Alert and oriented to person, place, and time  Abdomen  Normoactive bowel sounds, abdomen soft, non-tender and nonobese without evidence of hernia.  Back No CVA tenderness Genito Urinary  Vulva/vagina: Normal external female genitalia.   No lesions. No discharge or  bleeding.  Bladder/urethra:  No lesions or masses, well supported bladder  Vagina: no lesions  Cervix: pinpoint os flush with upper vagina. No palpable or visible cervical stroma or ectocervix.   Uterus:  Small, mobile, no parametrial involvement or nodularity.  Adnexa: no palpable masses. Rectal  deferred  Extremities  No bilateral cyanosis, clubbing or edema.  60 minutes of total time was spent for  this patient encounter, including preparation, face-to-face counseling with the patient and coordination of care, review of imaging (results and images), communication with the referring provider and documentation of the encounter.   [name], MD  04/05/2020, 10:12 AM DD: Dr Florene Route

## 2020-04-05 NOTE — Patient Instructions (Signed)
Dr Denman George is recommending a robotic assisted total hysterectomy with removal of both tubes and ovaries. This is planned for November 8th, 2021 at the [facility].  Dr Denman George will schedule you to see Joylene John, NP for a formal preop visit at which time she will explain further details about your surgery and preop and postop course (including do's and don'ts).

## 2020-04-06 ENCOUNTER — Other Ambulatory Visit: Payer: Self-pay | Admitting: Gynecologic Oncology

## 2020-04-06 DIAGNOSIS — N879 Dysplasia of cervix uteri, unspecified: Secondary | ICD-10-CM

## 2020-04-06 DIAGNOSIS — N838 Other noninflammatory disorders of ovary, fallopian tube and broad ligament: Secondary | ICD-10-CM

## 2020-04-09 DIAGNOSIS — H9313 Tinnitus, bilateral: Secondary | ICD-10-CM | POA: Diagnosis not present

## 2020-04-09 DIAGNOSIS — H903 Sensorineural hearing loss, bilateral: Secondary | ICD-10-CM | POA: Diagnosis not present

## 2020-04-09 DIAGNOSIS — H6983 Other specified disorders of Eustachian tube, bilateral: Secondary | ICD-10-CM | POA: Diagnosis not present

## 2020-04-23 NOTE — Patient Instructions (Addendum)
DUE TO COVID-19 ONLY ONE VISITOR IS ALLOWED TO COME WITH YOU AND STAY IN THE WAITING ROOM ONLY DURING PRE OP AND PROCEDURE.   IF YOU WILL BE ADMITTED INTO THE HOSPITAL YOU ARE ALLOWED ONE SUPPORT PERSON DURING VISITATION HOURS ONLY (10AM -8PM)   . The support person may change daily. . The support person must pass our screening, gel in and out, and wear a mask at all times, including in the patient's room. . Patients must also wear a mask when staff or their support person are in the room.   COVID SWAB TESTING MUST BE COMPLETED ON:  Thursday, 04-26-20 @  2:50 PM   [address] [place], [place] [address]  (Must self quarantine after testing. Follow instructions on handout.)        Your procedure is scheduled on:  Monday, 04-30-20   Report to [facility] Main  Entrance    Report to admitting at 6:15 AM   Call this number if you have problems the morning of surgery [phone]    Light diet day before surgery (avoid gas producing foods   Do not eat food :After Midnight.   May have liquids until 5:15 AM  day of surgery  CLEAR LIQUID DIET  Foods Allowed                                                                     Foods Excluded  Water, Black Coffee and tea, regular and decaf            liquids that you cannot  Plain Jell-O in any flavor  (No red)                                  see through such as: Fruit ices (not with fruit pulp)                                      milk, soups, orange juice              Iced Popsicles (No red)                                      All solid food                                   Apple juices Sports drinks like Gatorade (No red) Lightly seasoned clear broth or consume(fat free) Sugar, honey syrup    Oral Hygiene is also important to reduce your risk of infection.                                    Remember - BRUSH YOUR TEETH THE MORNING OF SURGERY WITH YOUR REGULAR TOOTHPASTE   Do NOT smoke after Midnight   Take these  medicines the morning of surgery with A SIP OF WATER:  Pravastatin  You may not have any metal on your body including hair pins, jewelry, and body piercings             Do not wear make-up, lotions, powders, perfumes/cologne, or deodorant             Do not wear nail polish.  Do not shave  48 hours prior to surgery.      Do not bring valuables to the hospital. [place].   Contacts, dentures or bridgework may not be worn into surgery.   Patients discharged the day of surgery will not be allowed to drive home.               Please read over the following fact sheets you were given: IF YOU HAVE QUESTIONS ABOUT YOUR PRE OP INSTRUCTIONS PLEASE CALL [phone]   [place] - Preparing for Surgery Before surgery, you can play an important role.  Because skin is not sterile, your skin needs to be as free of germs as possible.  You can reduce the number of germs on your skin by washing with CHG (chlorahexidine gluconate) soap before surgery.  CHG is an antiseptic cleaner which kills germs and bonds with the skin to continue killing germs even after washing. Please DO NOT use if you have an allergy to CHG or antibacterial soaps.  If your skin becomes reddened/irritated stop using the CHG and inform your nurse when you arrive at Short Stay. Do not shave (including legs and underarms) for at least 48 hours prior to the first CHG shower.  You may shave your face/neck.  Please follow these instructions carefully:  1.  Shower with CHG Soap the night before surgery and the  morning of surgery.  2.  If you choose to wash your hair, wash your hair first as usual with your normal  shampoo.  3.  After you shampoo, rinse your hair and body thoroughly to remove the shampoo.                             4.  Use CHG as you would any other liquid soap.  You can apply chg directly to the skin and wash.  Gently with a scrungie or clean  washcloth.  5.  Apply the CHG Soap to your body ONLY FROM THE NECK DOWN.   Do   not use on face/ open                           Wound or open sores. Avoid contact with eyes, ears mouth and   genitals (private parts).                       Wash face,  Genitals (private parts) with your normal soap.             6.  Wash thoroughly, paying special attention to the area where your    surgery  will be performed.  7.  Thoroughly rinse your body with warm water from the neck down.  8.  DO NOT shower/wash with your normal soap after using and rinsing off the CHG Soap.                9.  Pat yourself dry with a clean towel.            10.  Wear  clean pajamas.            11.  Place clean sheets on your bed the night of your first shower and do not  sleep with pets. Day of Surgery : Do not apply any lotions/deodorants the morning of surgery.  Please wear clean clothes to the hospital/surgery center.  FAILURE TO FOLLOW THESE INSTRUCTIONS MAY RESULT IN THE CANCELLATION OF YOUR SURGERY  PATIENT SIGNATURE_________________________________  NURSE SIGNATURE__________________________________  ________________________________________________________________________  WHAT IS A BLOOD TRANSFUSION? Blood Transfusion Information  A transfusion is the replacement of blood or some of its parts. Blood is made up of multiple cells which provide different functions.  Red blood cells carry oxygen and are used for blood loss replacement.  White blood cells fight against infection.  Platelets control bleeding.  Plasma helps clot blood.  Other blood products are available for specialized needs, such as hemophilia or other clotting disorders. BEFORE THE TRANSFUSION  Who gives blood for transfusions?   Healthy volunteers who are fully evaluated to make sure their blood is safe. This is blood bank blood. Transfusion therapy is the safest it has ever been in the practice of medicine. Before blood is taken from a  donor, a complete history is taken to make sure that person has no history of diseases nor engages in risky social behavior (examples are intravenous drug use or sexual activity with multiple partners). The donor's travel history is screened to minimize risk of transmitting infections, such as malaria. The donated blood is tested for signs of infectious diseases, such as HIV and hepatitis. The blood is then tested to be sure it is compatible with you in order to minimize the chance of a transfusion reaction. If you or a relative donates blood, this is often done in anticipation of surgery and is not appropriate for emergency situations. It takes many days to process the donated blood. RISKS AND COMPLICATIONS Although transfusion therapy is very safe and saves many lives, the main dangers of transfusion include:   Getting an infectious disease.  Developing a transfusion reaction. This is an allergic reaction to something in the blood you were given. Every precaution is taken to prevent this. The decision to have a blood transfusion has been considered carefully by your caregiver before blood is given. Blood is not given unless the benefits outweigh the risks. AFTER THE TRANSFUSION  Right after receiving a blood transfusion, you will usually feel much better and more energetic. This is especially true if your red blood cells have gotten low (anemic). The transfusion raises the level of the red blood cells which carry oxygen, and this usually causes an energy increase.  The nurse administering the transfusion will monitor you carefully for complications. HOME CARE INSTRUCTIONS  No special instructions are needed after a transfusion. You may find your energy is better. Speak with your caregiver about any limitations on activity for underlying diseases you may have. SEEK MEDICAL CARE IF:   Your condition is not improving after your transfusion.  You develop redness or irritation at the intravenous (IV)  site. SEEK IMMEDIATE MEDICAL CARE IF:  Any of the following symptoms occur over the next 12 hours:  Shaking chills.  You have a temperature by mouth above 102 F (38.9 C), not controlled by medicine.  Chest, back, or muscle pain.  People around you feel you are not acting correctly or are confused.  Shortness of breath or difficulty breathing.  Dizziness and fainting.  You get a rash or develop hives.  You have a decrease in urine output.  Your urine turns a dark color or changes to pink, red, or brown. Any of the following symptoms occur over the next 10 days:  You have a temperature by mouth above 102 F (38.9 C), not controlled by medicine.  Shortness of breath.  Weakness after normal activity.  The white part of the eye turns yellow (jaundice).  You have a decrease in the amount of urine or are urinating less often.  Your urine turns a dark color or changes to pink, red, or brown. Document Released: 06/06/2000 Document Revised: 09/01/2011 Document Reviewed: 01/24/2008 [facility] Patient Information 2014 [place], [place].  _______________________________________________________________________

## 2020-04-23 NOTE — Patient Instructions (Signed)
Preparing for your Surgery  Plan for surgery on April 30, 2020 with Dr. Everitt Amber at [facility] will be scheduled for a robotic assisted total hysterectomy (removal of uterus and cervix) with bilateral salpingo-oophorectomy (removal of both fallopian tubes and ovaries).   Pre-operative Testing -You will receive a phone call from presurgical testing at [facility] to arrange for a pre-operative appointment, lab appointment, and COVID test. The COVID test normally happens 3 days prior to the surgery and they ask that you self quarantine after the test up until surgery to decrease chance of exposure.  -Bring your insurance card, copy of an advanced directive if applicable, medication list  -At that visit, you will be asked to sign a consent for a possible blood transfusion in case a transfusion becomes necessary during surgery.  The need for a blood transfusion is rare but having consent is a necessary part of your care.     -You should not be taking blood thinners or aspirin at least ten days prior to surgery unless instructed by your surgeon.  -Do not take supplements such as fish oil (omega 3), red yeast rice, turmeric before your surgery.   Day Before Surgery at [facility] will be asked to take in a light diet the day before surgery. You will be advised you can have clear liquids up until 3 hours before your surgery.    Eat a light diet the day before surgery.  Examples including soups, broths, toast, yogurt, mashed potatoes.  AVOID GAS PRODUCING FOODS. Things to avoid include carbonated beverages (fizzy beverages), raw fruits and raw vegetables, or beans.   If your bowels are filled with gas, your surgeon will have difficulty visualizing your pelvic organs which increases your surgical risks.  Your role in recovery Your role is to become active as soon as directed by your doctor, while still giving yourself time to heal.  Rest when you feel tired. You will be asked  to do the following in order to speed your recovery:  - Cough and breathe deeply. This helps to clear and expand your lungs and can prevent pneumonia after surgery.  - [place]. Do mild physical activity. Walking or moving your legs help your circulation and body functions return to normal. Do not try to get up or walk alone the first time after surgery.   -If you develop swelling on one leg or the other, pain in the back of your leg, redness/warmth in one of your legs, please call the office or go to the Emergency Room to have a doppler to rule out a blood clot. For shortness of breath, chest pain-seek care in the Emergency Room as soon as possible. - Actively manage your pain. Managing your pain lets you move in comfort. We will ask you to rate your pain on a scale of zero to 10. It is your responsibility to tell your doctor or nurse where and how much you hurt so your pain can be treated.  Special Considerations -If you are diabetic, you may be placed on insulin after surgery to have closer control over your blood sugars to promote healing and recovery.  This does not mean that you will be discharged on insulin.  If applicable, your oral antidiabetics will be resumed when you are tolerating a solid diet.  -Your final pathology results from surgery should be available around one week after surgery and the results will be relayed to you when available.  -  FMLA forms can be faxed to [phone] and please allow 5-7 business days for completion.  Pain Management After Surgery -You have been prescribed your pain medication and bowel regimen medications before surgery so that you can have these available when you are discharged from the hospital. The pain medication is for use ONLY AFTER surgery and a new prescription will not be given.   -Make sure that you have Tylenol and Ibuprofen at home to use on a regular basis after surgery for pain control. We recommend alternating the  medications every hour to six hours since they work differently and are processed in the body differently for pain relief.  -Review the attached handout on narcotic use and their risks and side effects.   Bowel Regimen -You have been prescribed Sennakot-S to take nightly to prevent constipation especially if you are taking the narcotic pain medication intermittently.  It is important to prevent constipation and drink adequate amounts of liquids. You can stop taking this medication when you are not taking pain medication and you are back on your normal bowel routine.  Risks of Surgery Risks of surgery are low but include bleeding, infection, damage to surrounding structures, re-operation, blood clots, and very rarely death.   Blood Transfusion Information (For the consent to be signed before surgery)  We will be checking your blood type before surgery so in case of emergencies, we will know what type of blood you would need.                                            WHAT IS A BLOOD TRANSFUSION?  A transfusion is the replacement of blood or some of its parts. Blood is made up of multiple cells which provide different functions.  Red blood cells carry oxygen and are used for blood loss replacement.  White blood cells fight against infection.  Platelets control bleeding.  Plasma helps clot blood.  Other blood products are available for specialized needs, such as hemophilia or other clotting disorders. BEFORE THE TRANSFUSION  Who gives blood for transfusions?   You may be able to donate blood to be used at a later date on yourself (autologous donation).  Relatives can be asked to donate blood. This is generally not any safer than if you have received blood from a stranger. The same precautions are taken to ensure safety when a relative's blood is donated.  Healthy volunteers who are fully evaluated to make sure their blood is safe. This is blood bank blood. Transfusion therapy is the  safest it has ever been in the practice of medicine. Before blood is taken from a donor, a complete history is taken to make sure that person has no history of diseases nor engages in risky social behavior (examples are intravenous drug use or sexual activity with multiple partners). The donor's travel history is screened to minimize risk of transmitting infections, such as malaria. The donated blood is tested for signs of infectious diseases, such as HIV and hepatitis. The blood is then tested to be sure it is compatible with you in order to minimize the chance of a transfusion reaction. If you or a relative donates blood, this is often done in anticipation of surgery and is not appropriate for emergency situations. It takes many days to process the donated blood. RISKS AND COMPLICATIONS Although transfusion therapy is very safe and saves many  lives, the main dangers of transfusion include:   Getting an infectious disease.  Developing a transfusion reaction. This is an allergic reaction to something in the blood you were given. Every precaution is taken to prevent this. The decision to have a blood transfusion has been considered carefully by your caregiver before blood is given. Blood is not given unless the benefits outweigh the risks.  AFTER SURGERY INSTRUCTIONS  Return to work: 4 weeks if applicable  Activity: 1. Be up and out of the bed during the day.  Take a nap if needed.  You may walk up steps but be careful and use the hand rail.  Stair climbing will tire you more than you think, you may need to stop part way and rest.   2. No lifting or straining for 6 weeks over 10 pounds. No pushing, pulling, straining for 6 weeks.  3. No driving for 1 week(s).  Do not drive if you are taking narcotic pain medicine and make sure that your reaction time has returned.   4. You can shower as soon as the next day after surgery. Shower daily.  Use your regular soap and water (not directly on the  incision) and pat your incision(s) dry afterwards; don't rub.  No tub baths or submerging your body in water until cleared by your surgeon. If you have the soap that was given to you by pre-surgical testing that was used before surgery, you do not need to use it afterwards because this can irritate your incisions.   5. No sexual activity and nothing in the vagina for 8 weeks.  6. You may experience a small amount of clear drainage from your incisions, which is normal.  If the drainage persists, increases, or changes color please call the office.  7. Do not use creams, lotions, or ointments such as neosporin on your incisions after surgery until advised by your surgeon because they can cause removal of the dermabond glue on your incisions.    8. You may experience vaginal spotting after surgery or around the 6-8 week mark from surgery when the stitches at the top of the vagina begin to dissolve.  The spotting is normal but if you experience heavy bleeding, call our office.  9. Take Tylenol or ibuprofen first for pain and only use narcotic pain medication for severe pain not relieved by the Tylenol or Ibuprofen.  Monitor your Tylenol intake to a max of 4,000 mg in a 24 hour period. You can alternate these medications after surgery.  Diet: 1. Low sodium Heart Healthy Diet is recommended but you are cleared to resume your normal (before surgery) diet after your procedure.  2. It is safe to use a laxative, such as Miralax or Colace, if you have difficulty moving your bowels. You have been prescribed Sennakot at bedtime every evening to keep bowel movements regular and to prevent constipation.    Wound Care: 1. Keep clean and dry.  Shower daily.  Reasons to call the Doctor:  Fever - Oral temperature greater than 100.4 degrees Fahrenheit  Foul-smelling vaginal discharge  Difficulty urinating  Nausea and vomiting  Increased pain at the site of the incision that is unrelieved with pain  medicine.  Difficulty breathing with or without chest pain  New calf pain especially if only on one side  Sudden, continuing increased vaginal bleeding with or without clots.   Contacts: For questions or concerns you should contact:  Dr. Everitt Amber at [place], NP at  [phone]  After Hours: call [phone] and have the GYN Oncologist paged/contacted (after 5 pm or on the weekends)

## 2020-04-23 NOTE — Progress Notes (Addendum)
COVID Vaccine Completed: x2 Date COVID Vaccine completed:  08-20-19 & 09-10-19 COVID vaccine manufacturer: Pfizer    Moderna   [employer]   PCP - Kathyrn Lass, MD Cardiologist -   Chest x-ray -  EKG -  Stress Test -  ECHO -  Cardiac Cath -  Pacemaker/ICD device last checked:  Sleep Study -  CPAP -   Fasting Blood Sugar -  Checks Blood Sugar _____ times a day  Blood Thinner Instructions: Aspirin Instructions: Last Dose:  Anesthesia review:   Patient denies shortness of breath, fever, cough and chest pain at PAT appointment   Patient verbalized understanding of instructions that were given to them at the PAT appointment. Patient was also instructed that they will need to review over the PAT instructions again at home before surgery.

## 2020-04-26 ENCOUNTER — Encounter: Payer: Self-pay | Admitting: Gynecologic Oncology

## 2020-04-26 ENCOUNTER — Inpatient Hospital Stay: Payer: BC Managed Care – PPO | Attending: Gynecologic Oncology | Admitting: Gynecologic Oncology

## 2020-04-26 ENCOUNTER — Encounter (HOSPITAL_COMMUNITY)
Admission: RE | Admit: 2020-04-26 | Discharge: 2020-04-26 | Disposition: A | Discharge status: Home / Self Care | Payer: BC Managed Care – PPO | Source: Ambulatory Visit | Attending: Gynecologic Oncology | Admitting: Gynecologic Oncology

## 2020-04-26 ENCOUNTER — Encounter (HOSPITAL_COMMUNITY): Payer: Self-pay

## 2020-04-26 ENCOUNTER — Other Ambulatory Visit (HOSPITAL_COMMUNITY)
Admission: RE | Admit: 2020-04-26 | Discharge: 2020-04-26 | Disposition: A | Discharge status: Home / Self Care | Payer: BC Managed Care – PPO | Source: Ambulatory Visit | Attending: Gynecologic Oncology | Admitting: Gynecologic Oncology

## 2020-04-26 ENCOUNTER — Other Ambulatory Visit: Payer: Self-pay

## 2020-04-26 VITALS — BP 121/62 | HR 82 | Temp 97.2°F | Resp 18 | Ht 66.0 in | Wt 178.0 lb

## 2020-04-26 DIAGNOSIS — Z20822 Contact with and (suspected) exposure to covid-19: Secondary | ICD-10-CM | POA: Insufficient documentation

## 2020-04-26 DIAGNOSIS — Z90710 Acquired absence of both cervix and uterus: Secondary | ICD-10-CM | POA: Insufficient documentation

## 2020-04-26 DIAGNOSIS — N879 Dysplasia of cervix uteri, unspecified: Secondary | ICD-10-CM

## 2020-04-26 DIAGNOSIS — Z90722 Acquired absence of ovaries, bilateral: Secondary | ICD-10-CM | POA: Insufficient documentation

## 2020-04-26 DIAGNOSIS — N838 Other noninflammatory disorders of ovary, fallopian tube and broad ligament: Secondary | ICD-10-CM

## 2020-04-26 DIAGNOSIS — R87611 Atypical squamous cells cannot exclude high grade squamous intraepithelial lesion on cytologic smear of cervix (ASC-H): Secondary | ICD-10-CM

## 2020-04-26 DIAGNOSIS — Z01812 Encounter for preprocedural laboratory examination: Secondary | ICD-10-CM | POA: Insufficient documentation

## 2020-04-26 DIAGNOSIS — N870 Mild cervical dysplasia: Secondary | ICD-10-CM | POA: Insufficient documentation

## 2020-04-26 DIAGNOSIS — R87810 Cervical high risk human papillomavirus (HPV) DNA test positive: Secondary | ICD-10-CM

## 2020-04-26 HISTORY — DX: Basal cell carcinoma of skin of left lower limb, including hip: C44.719

## 2020-04-26 LAB — COMPREHENSIVE METABOLIC PANEL
ALT: 61 U/L — ABNORMAL HIGH (ref 0–44)
AST: 41 U/L (ref 15–41)
Albumin: 4.5 g/dL (ref 3.5–5.0)
Alkaline Phosphatase: 75 U/L (ref 38–126)
Anion gap: 10 (ref 5–15)
BUN: 12 mg/dL (ref 8–23)
CO2: 22 mmol/L (ref 22–32)
Calcium: 9.5 mg/dL (ref 8.9–10.3)
Chloride: 109 mmol/L (ref 98–111)
Creatinine, Ser: 0.7 mg/dL (ref 0.44–1.00)
GFR, Estimated: >60 mL/min (ref >60)
Glucose, Bld: 96 mg/dL (ref 70–99)
Potassium: 3.8 mmol/L (ref 3.5–5.1)
Sodium: 141 mmol/L (ref 135–145)
Total Bilirubin: 0.8 mg/dL (ref 0.3–1.2)
Total Protein: 7.1 g/dL (ref 6.5–8.1)

## 2020-04-26 LAB — URINALYSIS, ROUTINE W REFLEX MICROSCOPIC
Bilirubin Urine: NEGATIVE
Glucose, UA: NEGATIVE mg/dL
Hgb urine dipstick: NEGATIVE
Ketones, ur: NEGATIVE mg/dL
Leukocytes,Ua: NEGATIVE
Nitrite: NEGATIVE
Protein, ur: NEGATIVE mg/dL
Specific Gravity, Urine: 1.006 (ref 1.005–1.030)
pH: 7 (ref 5.0–8.0)

## 2020-04-26 LAB — CBC
HCT: 43.7 % (ref 36.0–46.0)
Hemoglobin: 14.7 g/dL (ref 12.0–15.0)
MCH: 30.2 pg (ref 26.0–34.0)
MCHC: 33.6 g/dL (ref 30.0–36.0)
MCV: 89.9 fL (ref 80.0–100.0)
Platelets: 230 10*3/uL (ref 150–400)
RBC: 4.86 MIL/uL (ref 3.87–5.11)
RDW: 12.3 % (ref 11.5–15.5)
WBC: 6.3 10*3/uL (ref 4.0–10.5)
nRBC: 0 % (ref 0.0–0.2)

## 2020-04-26 LAB — SARS CORONAVIRUS 2 (TAT 6-24 HRS): SARS Coronavirus 2: NEGATIVE

## 2020-04-26 MED ORDER — TRAMADOL HCL 50 MG PO TABS: 50.0000 mg | ORAL_TABLET | Freq: Four times a day (QID) | ORAL | 0 refills | Status: DC | PRN

## 2020-04-26 MED ORDER — IBUPROFEN 800 MG PO TABS: 800.0000 mg | ORAL_TABLET | Freq: Three times a day (TID) | ORAL | 0 refills | Status: AC | PRN

## 2020-04-26 MED ORDER — SENNOSIDES-DOCUSATE SODIUM 8.6-50 MG PO TABS: 2.0000 | ORAL_TABLET | Freq: Every day | ORAL | 0 refills | Status: AC

## 2020-04-26 NOTE — Progress Notes (Signed)
Patient here for a pre-operative appointment prior to her scheduled surgery on April 30, 2020. She is scheduled for a robotic assisted total laparoscopic hysterectomy (removal of the uterus and cervix), bilateral salpingo-oophorectomy (removal of both ovaries and fallopian tubes).  She had her pre-admission testing appointment this am at [facility].  The surgery was discussed in detail.  See after visit summary for additional details. Visual aids used to discuss items related to surgery including the incentive spirometer, sequential compression stockings, foley catheter, IV pump, multi-modal pain regimen including tylenol, photo of the surgical robot, female reproductive system to discuss surgery in detail.      Discussed post-op pain management in detail including the aspects of the enhanced recovery pathway.  Advised her that a new prescription would be sent in for tramadol and it is only to be used for after her upcoming surgery.  We discussed the use of tylenol post-op and to monitor for a maximum of 4,000 mg in a 24 hour period.  Also prescribed sennakot to be used after surgery and to hold if having loose stools.  Discussed bowel regimen in detail.     Discussed the use of lovenox pre-op, SCDs, and measures to take at home to prevent DVT including frequent mobility.  Reportable signs and symptoms of DVT discussed. Post-operative instructions discussed and expectations for after surgery. Incisional care discussed as well including reportable signs and symptoms including erythema, drainage, wound separation.     10 minutes spent with the patient.  Verbalizing understanding of material discussed. No needs or concerns voiced at the end of the visit.   Advised patient to call for any needs.  Advised that her post-operative medications had been prescribed and could be picked up at any time.

## 2020-04-27 ENCOUNTER — Telehealth: Payer: Self-pay

## 2020-04-27 NOTE — Telephone Encounter (Signed)
Christine Austin states that she understands her her written pre op instructions and does not have any questions or concerns at this time.

## 2020-04-29 NOTE — Anesthesia Preprocedure Evaluation (Addendum)
Anesthesia Evaluation  Patient identified by MRN, date of birth, ID band Patient awake    Reviewed: Allergy & Precautions, NPO status , Patient's Chart, lab work & pertinent test results  History of Anesthesia Complications (+) PONV and history of anesthetic complications  Airway Mallampati: I       Dental no notable dental hx.    Pulmonary neg pulmonary ROS,    Pulmonary exam normal        Cardiovascular negative cardio ROS Normal cardiovascular exam     Neuro/Psych negative neurological ROS  negative psych ROS   GI/Hepatic negative GI ROS, Neg liver ROS,   Endo/Other  negative endocrine ROS  Renal/GU negative Renal ROS  negative genitourinary   Musculoskeletal negative musculoskeletal ROS (+)   Abdominal Normal abdominal exam  (+)   Peds  Hematology negative hematology ROS (+)   Anesthesia Other Findings   Reproductive/Obstetrics                            Anesthesia Physical Anesthesia Plan  ASA: I  Anesthesia Plan: General   Post-op Pain Management:    Induction: Intravenous  PONV Risk Score and Plan: 4 or greater and Ondansetron, Dexamethasone and Midazolam  Airway Management Planned: Oral ETT  Additional Equipment: None  Intra-op Plan:   Post-operative Plan: Extubation in OR  Informed Consent: I have reviewed the patients History and Physical, chart, labs and discussed the procedure including the risks, benefits and alternatives for the proposed anesthesia with the patient or authorized representative who has indicated his/her understanding and acceptance.     Dental advisory given  Plan Discussed with: CRNA  Anesthesia Plan Comments:        Anesthesia Quick Evaluation

## 2020-04-30 ENCOUNTER — Ambulatory Visit (HOSPITAL_COMMUNITY): Payer: BC Managed Care – PPO | Admitting: Anesthesiology

## 2020-04-30 ENCOUNTER — Encounter (HOSPITAL_COMMUNITY): Payer: Self-pay | Admitting: Gynecologic Oncology

## 2020-04-30 ENCOUNTER — Ambulatory Visit (HOSPITAL_COMMUNITY)
Admission: RE | Admit: 2020-04-30 | Discharge: 2020-04-30 | Disposition: A | Discharge status: Home / Self Care | Payer: BC Managed Care – PPO | Attending: Gynecologic Oncology | Admitting: Gynecologic Oncology

## 2020-04-30 ENCOUNTER — Encounter (HOSPITAL_COMMUNITY)
Admission: RE | Disposition: A | Discharge status: Home / Self Care | Payer: Self-pay | Source: Home / Self Care | Attending: Gynecologic Oncology

## 2020-04-30 DIAGNOSIS — Z79899 Other long term (current) drug therapy: Secondary | ICD-10-CM | POA: Diagnosis not present

## 2020-04-30 DIAGNOSIS — N879 Dysplasia of cervix uteri, unspecified: Secondary | ICD-10-CM

## 2020-04-30 DIAGNOSIS — D069 Carcinoma in situ of cervix, unspecified: Secondary | ICD-10-CM | POA: Diagnosis not present

## 2020-04-30 DIAGNOSIS — N83202 Unspecified ovarian cyst, left side: Secondary | ICD-10-CM | POA: Diagnosis not present

## 2020-04-30 DIAGNOSIS — R87611 Atypical squamous cells cannot exclude high grade squamous intraepithelial lesion on cytologic smear of cervix (ASC-H): Secondary | ICD-10-CM | POA: Diagnosis not present

## 2020-04-30 DIAGNOSIS — R87810 Cervical high risk human papillomavirus (HPV) DNA test positive: Secondary | ICD-10-CM | POA: Diagnosis not present

## 2020-04-30 DIAGNOSIS — E7800 Pure hypercholesterolemia, unspecified: Secondary | ICD-10-CM | POA: Diagnosis not present

## 2020-04-30 DIAGNOSIS — N838 Other noninflammatory disorders of ovary, fallopian tube and broad ligament: Secondary | ICD-10-CM | POA: Insufficient documentation

## 2020-04-30 DIAGNOSIS — R87613 High grade squamous intraepithelial lesion on cytologic smear of cervix (HGSIL): Secondary | ICD-10-CM | POA: Diagnosis not present

## 2020-04-30 DIAGNOSIS — N870 Mild cervical dysplasia: Secondary | ICD-10-CM | POA: Insufficient documentation

## 2020-04-30 HISTORY — PX: ROBOTIC ASSISTED TOTAL HYSTERECTOMY WITH BILATERAL SALPINGO OOPHERECTOMY: SHX6086

## 2020-04-30 LAB — TYPE AND SCREEN
ABO/RH(D): O POS
Antibody Screen: NEGATIVE

## 2020-04-30 LAB — ABO/RH: ABO/RH(D): O POS

## 2020-04-30 SURGERY — HYSTERECTOMY, TOTAL, ROBOT-ASSISTED, LAPAROSCOPIC, WITH BILATERAL SALPINGO-OOPHORECTOMY
Anesthesia: General | Site: Abdomen | Laterality: Bilateral

## 2020-04-30 MED ORDER — GABAPENTIN 300 MG PO CAPS
300.0000 mg | ORAL_CAPSULE | ORAL | Status: AC
  Administered 2020-04-30: 300 mg via ORAL
  Filled 2020-04-30: qty 1

## 2020-04-30 MED ORDER — OXYCODONE HCL 5 MG PO TABS: 5.0000 mg | ORAL_TABLET | Freq: Once | ORAL | Status: DC | PRN

## 2020-04-30 MED ORDER — STERILE WATER FOR INJECTION IJ SOLN
INTRAMUSCULAR | Status: AC
  Filled 2020-04-30: qty 10

## 2020-04-30 MED ORDER — DEXAMETHASONE SODIUM PHOSPHATE 4 MG/ML IJ SOLN
INTRAMUSCULAR | Status: DC | PRN
  Administered 2020-04-30: 10 mg via INTRAVENOUS

## 2020-04-30 MED ORDER — MIDAZOLAM HCL 5 MG/5ML IJ SOLN
INTRAMUSCULAR | Status: DC | PRN
  Administered 2020-04-30: 2 mg via INTRAVENOUS

## 2020-04-30 MED ORDER — EPHEDRINE SULFATE-NACL 50-0.9 MG/10ML-% IV SOSY
PREFILLED_SYRINGE | INTRAVENOUS | Status: DC | PRN
  Administered 2020-04-30: 10 mg via INTRAVENOUS

## 2020-04-30 MED ORDER — KETOROLAC TROMETHAMINE 30 MG/ML IJ SOLN
INTRAMUSCULAR | Status: AC
  Filled 2020-04-30: qty 1

## 2020-04-30 MED ORDER — DEXAMETHASONE SODIUM PHOSPHATE 10 MG/ML IJ SOLN
INTRAMUSCULAR | Status: AC
  Filled 2020-04-30: qty 1

## 2020-04-30 MED ORDER — MEPERIDINE HCL 50 MG/ML IJ SOLN: 6.2500 mg | INTRAMUSCULAR | Status: DC | PRN

## 2020-04-30 MED ORDER — FENTANYL CITRATE (PF) 100 MCG/2ML IJ SOLN
INTRAMUSCULAR | Status: AC
  Filled 2020-04-30: qty 2

## 2020-04-30 MED ORDER — SUGAMMADEX SODIUM 200 MG/2ML IV SOLN
INTRAVENOUS | Status: DC | PRN
  Administered 2020-04-30: 200 mg via INTRAVENOUS

## 2020-04-30 MED ORDER — BUPIVACAINE HCL 0.25 % IJ SOLN
INTRAMUSCULAR | Status: AC
  Filled 2020-04-30: qty 1

## 2020-04-30 MED ORDER — ONDANSETRON HCL 4 MG/2ML IJ SOLN: 4.0000 mg | Freq: Once | INTRAMUSCULAR | Status: DC | PRN

## 2020-04-30 MED ORDER — ESMOLOL HCL 100 MG/10ML IV SOLN
INTRAVENOUS | Status: DC | PRN
  Administered 2020-04-30: 30 mg via INTRAVENOUS

## 2020-04-30 MED ORDER — PROPOFOL 10 MG/ML IV BOLUS
INTRAVENOUS | Status: DC | PRN
  Administered 2020-04-30: 150 mg via INTRAVENOUS
  Administered 2020-04-30 (×2): 50 mg via INTRAVENOUS

## 2020-04-30 MED ORDER — LIDOCAINE 2% (20 MG/ML) 5 ML SYRINGE
INTRAMUSCULAR | Status: AC
  Filled 2020-04-30: qty 10

## 2020-04-30 MED ORDER — ACETAMINOPHEN 500 MG PO TABS
1000.0000 mg | ORAL_TABLET | ORAL | Status: AC
  Administered 2020-04-30: 1000 mg via ORAL
  Filled 2020-04-30: qty 2

## 2020-04-30 MED ORDER — ORAL CARE MOUTH RINSE: 15.0000 mL | Freq: Once | OROMUCOSAL | Status: AC

## 2020-04-30 MED ORDER — LIDOCAINE 2% (20 MG/ML) 5 ML SYRINGE
INTRAMUSCULAR | Status: DC | PRN
  Administered 2020-04-30: 60 mg via INTRAVENOUS

## 2020-04-30 MED ORDER — KETAMINE HCL 10 MG/ML IJ SOLN
INTRAMUSCULAR | Status: AC
  Filled 2020-04-30: qty 1

## 2020-04-30 MED ORDER — DEXAMETHASONE SODIUM PHOSPHATE 4 MG/ML IJ SOLN: 4.0000 mg | INTRAMUSCULAR | Status: DC

## 2020-04-30 MED ORDER — ROCURONIUM BROMIDE 10 MG/ML (PF) SYRINGE
PREFILLED_SYRINGE | INTRAVENOUS | Status: DC | PRN
  Administered 2020-04-30: 60 mg via INTRAVENOUS
  Administered 2020-04-30: 40 mg via INTRAVENOUS

## 2020-04-30 MED ORDER — 0.9 % SODIUM CHLORIDE (POUR BTL) OPTIME
TOPICAL | Status: DC | PRN
  Administered 2020-04-30: 1000 mL

## 2020-04-30 MED ORDER — CHLORHEXIDINE GLUCONATE 0.12 % MT SOLN
15.0000 mL | Freq: Once | OROMUCOSAL | Status: AC
  Administered 2020-04-30: 15 mL via OROMUCOSAL

## 2020-04-30 MED ORDER — ROCURONIUM BROMIDE 10 MG/ML (PF) SYRINGE
PREFILLED_SYRINGE | INTRAVENOUS | Status: AC
  Filled 2020-04-30: qty 10

## 2020-04-30 MED ORDER — ONDANSETRON HCL 4 MG/2ML IJ SOLN
INTRAMUSCULAR | Status: DC | PRN
  Administered 2020-04-30: 4 mg via INTRAVENOUS

## 2020-04-30 MED ORDER — FENTANYL CITRATE (PF) 100 MCG/2ML IJ SOLN
25.0000 ug | INTRAMUSCULAR | Status: DC | PRN
  Administered 2020-04-30: 25 ug via INTRAVENOUS

## 2020-04-30 MED ORDER — ONDANSETRON HCL 4 MG/2ML IJ SOLN
INTRAMUSCULAR | Status: AC
  Filled 2020-04-30: qty 2

## 2020-04-30 MED ORDER — MIDAZOLAM HCL 2 MG/2ML IJ SOLN
INTRAMUSCULAR | Status: AC
  Filled 2020-04-30: qty 2

## 2020-04-30 MED ORDER — BUPIVACAINE HCL 0.25 % IJ SOLN
INTRAMUSCULAR | Status: DC | PRN
  Administered 2020-04-30: 20 mL

## 2020-04-30 MED ORDER — FENTANYL CITRATE (PF) 100 MCG/2ML IJ SOLN
INTRAMUSCULAR | Status: DC | PRN
  Administered 2020-04-30: 100 ug via INTRAVENOUS
  Administered 2020-04-30: 50 ug via INTRAVENOUS

## 2020-04-30 MED ORDER — CEFAZOLIN SODIUM-DEXTROSE 2-4 GM/100ML-% IV SOLN
2.0000 g | INTRAVENOUS | Status: AC
  Administered 2020-04-30: 2 g via INTRAVENOUS
  Filled 2020-04-30: qty 100

## 2020-04-30 MED ORDER — ENOXAPARIN SODIUM 40 MG/0.4ML ~~LOC~~ SOLN
40.0000 mg | SUBCUTANEOUS | Status: AC
  Administered 2020-04-30: 40 mg via SUBCUTANEOUS
  Filled 2020-04-30: qty 0.4

## 2020-04-30 MED ORDER — EPHEDRINE 5 MG/ML INJ
INTRAVENOUS | Status: AC
  Filled 2020-04-30: qty 10

## 2020-04-30 MED ORDER — LIDOCAINE 2% (20 MG/ML) 5 ML SYRINGE
INTRAMUSCULAR | Status: DC | PRN
  Administered 2020-04-30: 1.5 mg/kg/h via INTRAVENOUS

## 2020-04-30 MED ORDER — ACETAMINOPHEN 325 MG PO TABS: 325.0000 mg | ORAL_TABLET | ORAL | Status: DC | PRN

## 2020-04-30 MED ORDER — OXYCODONE HCL 5 MG/5ML PO SOLN: 5.0000 mg | Freq: Once | ORAL | Status: DC | PRN

## 2020-04-30 MED ORDER — FENTANYL CITRATE (PF) 250 MCG/5ML IJ SOLN
INTRAMUSCULAR | Status: AC
  Filled 2020-04-30: qty 5

## 2020-04-30 MED ORDER — SCOPOLAMINE 1 MG/3DAYS TD PT72
1.0000 | MEDICATED_PATCH | TRANSDERMAL | Status: DC
  Administered 2020-04-30: 1.5 mg via TRANSDERMAL
  Filled 2020-04-30: qty 1

## 2020-04-30 MED ORDER — KETOROLAC TROMETHAMINE 30 MG/ML IJ SOLN
30.0000 mg | Freq: Once | INTRAMUSCULAR | Status: AC | PRN
  Administered 2020-04-30: 30 mg via INTRAVENOUS

## 2020-04-30 MED ORDER — CELECOXIB 200 MG PO CAPS
400.0000 mg | ORAL_CAPSULE | ORAL | Status: AC
  Administered 2020-04-30: 400 mg via ORAL
  Filled 2020-04-30: qty 2

## 2020-04-30 MED ORDER — PHENYLEPHRINE 40 MCG/ML (10ML) SYRINGE FOR IV PUSH (FOR BLOOD PRESSURE SUPPORT)
PREFILLED_SYRINGE | INTRAVENOUS | Status: DC | PRN
  Administered 2020-04-30 (×2): 80 ug via INTRAVENOUS
  Administered 2020-04-30: 40 ug via INTRAVENOUS
  Administered 2020-04-30 (×3): 80 ug via INTRAVENOUS

## 2020-04-30 MED ORDER — LACTATED RINGERS IR SOLN
Status: DC | PRN
  Administered 2020-04-30: 1000 mL

## 2020-04-30 MED ORDER — PROPOFOL 500 MG/50ML IV EMUL
INTRAVENOUS | Status: AC
  Filled 2020-04-30: qty 50

## 2020-04-30 MED ORDER — PHENYLEPHRINE 40 MCG/ML (10ML) SYRINGE FOR IV PUSH (FOR BLOOD PRESSURE SUPPORT)
PREFILLED_SYRINGE | INTRAVENOUS | Status: AC
  Filled 2020-04-30: qty 10

## 2020-04-30 MED ORDER — ACETAMINOPHEN 160 MG/5ML PO SOLN: 325.0000 mg | ORAL | Status: DC | PRN

## 2020-04-30 MED ORDER — LACTATED RINGERS IV SOLN: INTRAVENOUS | Status: DC

## 2020-04-30 SURGICAL SUPPLY — 65 items
APPLICATOR SURGIFLO ENDO (HEMOSTASIS) IMPLANT
BACTOSHIELD CHG 4% 4OZ (MISCELLANEOUS) ×1
BAG LAPAROSCOPIC 12 15 PORT 16 (BASKET) IMPLANT
BAG RETRIEVAL 12/15 (BASKET)
BLADE SURG SZ10 CARB STEEL (BLADE) IMPLANT
COVER BACK TABLE 60X90IN (DRAPES) ×2 IMPLANT
COVER TIP SHEARS 8 DVNC (MISCELLANEOUS) ×1 IMPLANT
COVER TIP SHEARS 8MM DA VINCI (MISCELLANEOUS) ×2
COVER WAND RF STERILE (DRAPES) IMPLANT
DECANTER SPIKE VIAL GLASS SM (MISCELLANEOUS) IMPLANT
DERMABOND ADVANCED (GAUZE/BANDAGES/DRESSINGS) ×1
DERMABOND ADVANCED .7 DNX12 (GAUZE/BANDAGES/DRESSINGS) ×1 IMPLANT
DRAPE ARM DVNC X/XI (DISPOSABLE) ×4 IMPLANT
DRAPE COLUMN DVNC XI (DISPOSABLE) ×1 IMPLANT
DRAPE DA VINCI XI ARM (DISPOSABLE) ×8
DRAPE DA VINCI XI COLUMN (DISPOSABLE) ×2
DRAPE SHEET LG 3/4 BI-LAMINATE (DRAPES) ×2 IMPLANT
DRAPE SURG IRRIG POUCH 19X23 (DRAPES) ×2 IMPLANT
DRSG OPSITE POSTOP 4X6 (GAUZE/BANDAGES/DRESSINGS) IMPLANT
DRSG OPSITE POSTOP 4X8 (GAUZE/BANDAGES/DRESSINGS) IMPLANT
ELECT PENCIL ROCKER SW 15FT (MISCELLANEOUS) IMPLANT
ELECT REM PT RETURN 15FT ADLT (MISCELLANEOUS) ×2 IMPLANT
GLOVE BIO SURGEON STRL SZ 6 (GLOVE) ×8 IMPLANT
GLOVE BIO SURGEON STRL SZ 6.5 (GLOVE) ×4 IMPLANT
GOWN STRL REUS W/ TWL LRG LVL3 (GOWN DISPOSABLE) ×4 IMPLANT
GOWN STRL REUS W/TWL LRG LVL3 (GOWN DISPOSABLE) ×8
HOLDER FOLEY CATH W/STRAP (MISCELLANEOUS) ×2 IMPLANT
IRRIG SUCT STRYKERFLOW 2 WTIP (MISCELLANEOUS) ×2
IRRIGATION SUCT STRKRFLW 2 WTP (MISCELLANEOUS) ×1 IMPLANT
KIT PROCEDURE DA VINCI SI (MISCELLANEOUS)
KIT PROCEDURE DVNC SI (MISCELLANEOUS) IMPLANT
KIT TURNOVER KIT A (KITS) ×2 IMPLANT
MANIPULATOR UTERINE 4.5 ZUMI (MISCELLANEOUS) ×2 IMPLANT
NEEDLE HYPO 22GX1.5 SAFETY (NEEDLE) ×2 IMPLANT
NEEDLE SPNL 18GX3.5 QUINCKE PK (NEEDLE) IMPLANT
OBTURATOR OPTICAL STANDARD 8MM (TROCAR) ×2
OBTURATOR OPTICAL STND 8 DVNC (TROCAR) ×1
OBTURATOR OPTICALSTD 8 DVNC (TROCAR) ×1 IMPLANT
PACK ROBOT GYN CUSTOM WL (TRAY / TRAY PROCEDURE) ×2 IMPLANT
PAD POSITIONING PINK XL (MISCELLANEOUS) ×2 IMPLANT
PORT ACCESS TROCAR AIRSEAL 12 (TROCAR) ×1 IMPLANT
PORT ACCESS TROCAR AIRSEAL 5M (TROCAR) ×1
POUCH SPECIMEN RETRIEVAL 10MM (ENDOMECHANICALS) IMPLANT
SCRUB CHG 4% DYNA-HEX 4OZ (MISCELLANEOUS) ×1 IMPLANT
SEAL CANN UNIV 5-8 DVNC XI (MISCELLANEOUS) ×3 IMPLANT
SEAL XI 5MM-8MM UNIVERSAL (MISCELLANEOUS) ×6
SET TRI-LUMEN FLTR TB AIRSEAL (TUBING) ×2 IMPLANT
SPONGE LAP 18X18 RF (DISPOSABLE) IMPLANT
SURGIFLO W/THROMBIN 8M KIT (HEMOSTASIS) IMPLANT
SUT MNCRL AB 4-0 PS2 18 (SUTURE) ×4 IMPLANT
SUT PDS AB 1 TP1 96 (SUTURE) IMPLANT
SUT VIC AB 0 CT1 27 (SUTURE)
SUT VIC AB 0 CT1 27XBRD ANTBC (SUTURE) IMPLANT
SUT VIC AB 2-0 CT1 27 (SUTURE)
SUT VIC AB 2-0 CT1 TAPERPNT 27 (SUTURE) IMPLANT
SUT VIC AB 4-0 PS2 18 (SUTURE) ×4 IMPLANT
SUT VICRYL 0 UR6 27IN ABS (SUTURE) ×2 IMPLANT
SYR 10ML LL (SYRINGE) IMPLANT
SYR 20ML LL LF (SYRINGE) IMPLANT
TOWEL OR NON WOVEN STRL DISP B (DISPOSABLE) ×2 IMPLANT
TRAP SPECIMEN MUCUS 40CC (MISCELLANEOUS) ×2 IMPLANT
TRAY FOLEY MTR SLVR 16FR STAT (SET/KITS/TRAYS/PACK) ×2 IMPLANT
TROCAR XCEL NON-BLD 5MMX100MML (ENDOMECHANICALS) IMPLANT
UNDERPAD 30X36 HEAVY ABSORB (UNDERPADS AND DIAPERS) ×2 IMPLANT
WATER STERILE IRR 1000ML POUR (IV SOLUTION) ×2 IMPLANT

## 2020-04-30 NOTE — Discharge Instructions (Signed)
04/30/2020  Return to work: 4-6 weeks if applicable  Activity: 1. Be up and out of the bed during the day.  Take a nap if needed.  You may walk up steps but be careful and use the hand rail.  Stair climbing will tire you more than you think, you may need to stop part way and rest.   2. No lifting or straining for 6 weeks over 10 lbs.  3. No driving for 1 week(s).  Do not drive if you are taking narcotic pain medicine.  4. Shower daily.  Use soap and water on your incision and pat dry; don't rub.  No tub baths until cleared by your surgeon.   5. No sexual activity and nothing in the vagina for 8 weeks.  6. You may experience a small amount of clear drainage from your incisions, which is normal.  If the drainage persists or increases, please call the office.  7. You may experience vaginal spotting after surgery or around the 6-8 week mark from surgery when the stitches at the top of the vagina begin to dissolve.  The spotting is normal but if you experience heavy bleeding, call our office.  8. Take Tylenol or ibuprofen first for pain and only use narcotic pain medication for severe pain not relieved by the Tylenol or Ibuprofen.  Monitor your Tylenol intake to a max of 4,000 mg.  Diet: 1. Low sodium Heart Healthy Diet is recommended.  2. It is safe to use a laxative, such as Miralax or Colace, if you have difficulty moving your bowels. You can take Sennakot at bedtime every evening to keep bowel movements regular and to prevent constipation.    Wound Care: 1. Keep clean and dry.  Shower daily.  Reasons to call the Doctor:  Fever - Oral temperature greater than 100.4 degrees Fahrenheit  Foul-smelling vaginal discharge  Difficulty urinating  Nausea and vomiting  Increased pain at the site of the incision that is unrelieved with pain medicine.  Difficulty breathing with or without chest pain  New calf pain especially if only on one side  Sudden, continuing increased vaginal  bleeding with or without clots.   Contacts: For questions or concerns you should contact:  Dr. Everitt Amber at [phone]  Joylene John, NP at [phone]  After Hours: call [phone] and have the GYN Oncologist paged/contacted

## 2020-04-30 NOTE — Op Note (Signed)
OPERATIVE NOTE 04/30/20  Surgeon: Donaciano Eva   Assistants: Joylene John NP (a provider assistant was necessary for tissue manipulation, management of robotic instrumentation, retraction and positioning due to the complexity of the case and hospital policies).   Anesthesia: General endotracheal anesthesia  ASA Class: 3   Pre-operative Diagnosis: HSIL pap  Post-operative Diagnosis: same and left ovarian cyst  Operation: Robotic-assisted laparoscopic total hysterectomy with bilateral salpingoophorectomy   Surgeon: Donaciano Eva  Assistant Surgeon: Joylene John NP  Anesthesia: GET  Urine Output: 100cc  Operative Findings:  : 6cm sized normal appearing uterus and mildly enlarged left ovary, no other abnormalities. Cervix flush with the vagina.  Estimated Blood Loss:  10cc      Total IV Fluids: 800 ml         Specimens: uterus with cervix and bilateral tubes and ovaries         Complications:  None; patient tolerated the procedure well.         Disposition: PACU - hemodynamically stable.  Procedure Details  The patient was seen in the Holding Room. The risks, benefits, complications, treatment options, and expected outcomes were discussed with the patient.  The patient concurred with the proposed plan, giving informed consent.  The site of surgery properly noted/marked. The patient was identified as Christine Austin and the procedure verified as a Robotic-assisted hysterectomy with bilateral salpingo oophorectomy. A Time Out was held and the above information confirmed.  After induction of anesthesia, the patient was draped and prepped in the usual sterile manner. Pt was placed in supine position after anesthesia and draped and prepped in the usual sterile manner. The abdominal drape was placed after the CholoraPrep had been allowed to dry for 3 minutes.  Her arms were tucked to her side with all appropriate precautions.  The shoulders were stabilized with  padded shoulder blocks applied to the acromium processes.  The patient was placed in the semi-lithotomy position in [place].  The perineum was prepped with Betadine. The patient was then prepped. Foley catheter was placed.  A sterile speculum was placed in the vagina.  An EEA sizer was placed in the vagina to define the cervicovaginal fornices.  OG tube placement was confirmed and to suction.   Next, a 5 mm skin incision was made 1 cm below the subcostal margin in the midclavicular line.  The 5 mm Optiview port and scope was used for direct entry.  Opening pressure was under 10 mm CO2.  The abdomen was insufflated and the findings were noted as above.   At this point and all points during the procedure, the patient's intra-abdominal pressure did not exceed 15 mmHg. Next, a 10 mm skin incision was made in the umbilicus and a right and left port was placed about 10 cm lateral to the robot port on the right and left side.  A fourth arm was placed in the left lower quadrant 2 cm above and superior and medial to the anterior superior iliac spine.  All ports were placed under direct visualization.  The patient was placed in steep Trendelenburg.  Bowel was folded away into the upper abdomen.  The robot was docked in the normal manner.  The hysterectomy was started after the round ligament on the right side was incised and the retroperitoneum was entered and the pararectal space was developed.  The ureter was noted to be on the medial leaf of the broad ligament.  The peritoneum above the ureter was incised and stretched  and the infundibulopelvic ligament was skeletonized, cauterized and cut.  The posterior peritoneum was taken down to the level of the uterine isthmus and then the posterior cul de sac was opened by separating the peritoneum from the posterior cervix and developing the rectovaginal septum.  The anterior peritoneum was also taken down using the EEA sizer as a guide.  The bladder flap was created to  the level of the EEA sizer.  The uterine artery on the right side was skeletonized, cauterized and cut in the normal manner.  A similar procedure was performed on the left.  The colpotomy was made and the uterus, cervix, bilateral ovaries and tubes were amputated and delivered through the vagina.  Pedicles were inspected and excellent hemostasis was achieved.    The colpotomy at the vaginal cuff was closed with Vicryl on a CT1 needle in a running manner.  Irrigation was used and excellent hemostasis was achieved.  At this point in the procedure was completed.  Robotic instruments were removed under direct visulaization.  The robot was undocked. The 10 mm ports were closed with Vicryl on a UR-5 needle and the fascia was closed with 0 Vicryl on a UR-5 needle.  The skin was closed with 4-0 Vicryl in a subcuticular manner.  Dermabond was applied.  Sponge, lap and needle counts correct x 2.  The patient was taken to the recovery room in stable condition.  The vagina was swabbed with  minimal bleeding noted.   All instrument and needle counts were correct x  3.   The patient was transferred to the recovery room in a stable condition.  Donaciano Eva, MD

## 2020-04-30 NOTE — Interval H&P Note (Signed)
History and Physical Interval Note:  04/30/2020 7:34 AM  Christine Austin  has presented today for surgery, with the diagnosis of CERVICAL INTRAEPITHELIAL NEOPLASIA III.  The various methods of treatment have been discussed with the patient and family. After consideration of risks, benefits and other options for treatment, the patient has consented to  Procedure(s): XI ROBOTIC ASSISTED TOTAL HYSTERECTOMY WITH BILATERAL SALPINGO OOPHORECTOMY (Bilateral) as a surgical intervention.  The patient's history has been reviewed, patient examined, no change in status, stable for surgery.  I have reviewed the patient's chart and labs.  Questions were answered to the patient's satisfaction.     [name]

## 2020-04-30 NOTE — Anesthesia Postprocedure Evaluation (Signed)
Anesthesia Post Note  Patient: Christine Austin  Procedure(s) Performed: XI ROBOTIC ASSISTED TOTAL HYSTERECTOMY WITH BILATERAL SALPINGO OOPHORECTOMY (Bilateral Abdomen)     Patient location during evaluation: PACU Anesthesia Type: General Level of consciousness: sedated Pain management: pain level controlled Vital Signs Assessment: post-procedure vital signs reviewed and stable Respiratory status: spontaneous breathing Cardiovascular status: stable Postop Assessment: no apparent nausea or vomiting Anesthetic complications: no   No complications documented.  Last Vitals:  Vitals:   04/30/20 1045 04/30/20 1100  BP: (!) 111/58 121/65  Pulse: 67 71  Resp: 14 14  Temp:    SpO2: 96% 96%    Last Pain:  Vitals:   04/30/20 1100  TempSrc:   PainSc: [name]

## 2020-04-30 NOTE — Transfer of Care (Signed)
Immediate Anesthesia Transfer of Care Note  Patient: Christine Austin  Procedure(s) Performed: XI ROBOTIC ASSISTED TOTAL HYSTERECTOMY WITH BILATERAL SALPINGO OOPHORECTOMY (Bilateral Abdomen)  Patient Location: PACU  Anesthesia Type:General  Level of Consciousness: awake  Airway & Oxygen Therapy: Patient Spontanous Breathing and Patient connected to face mask oxygen  Post-op Assessment: Report given to RN and Post -op Vital signs reviewed and stable  Post vital signs: Reviewed and stable  Last Vitals:  Vitals Value Taken Time  BP 129/63 04/30/20 1006  Temp    Pulse 84 04/30/20 1007  Resp 18 04/30/20 1007  SpO2 99 % 04/30/20 1007  Vitals shown include unvalidated device data.  Last Pain:  Vitals:   04/30/20 0624  TempSrc:   PainSc: 0-No pain         Complications: No complications documented.

## 2020-04-30 NOTE — Anesthesia Procedure Notes (Signed)
Procedure Name: Intubation Date/Time: 04/30/2020 8:20 AM Performed by: Eulas Post, Layth Cerezo W, CRNA Pre-anesthesia Checklist: Patient identified, Emergency Drugs available, Suction available and Patient being monitored Patient Re-evaluated:Patient Re-evaluated prior to induction Oxygen Delivery Method: Circle system utilized Preoxygenation: Pre-oxygenation with 100% oxygen Induction Type: IV induction Ventilation: Mask ventilation without difficulty Laryngoscope Size: Miller, 2 and Glidescope Grade View: Grade II Tube type: Oral Tube size: 7.0 mm Number of attempts: 4 Airway Equipment and Method: Stylet,  Bougie stylet and Video-laryngoscopy Placement Confirmation: ETT inserted through vocal cords under direct vision,  positive ETCO2 and breath sounds checked- equal and bilateral Secured at: 22 cm Tube secured with: Tape Dental Injury: Teeth and Oropharynx as per pre-operative assessment  Difficulty Due To: Difficulty was unanticipated, Difficult Airway- due to large tongue, Difficult Airway- due to reduced neck mobility, Difficult Airway- due to anterior larynx and Difficult Airway- due to limited oral opening Future Recommendations: Recommend- induction with short-acting agent, and alternative techniques readily available Comments: Induction, DL x 1Miller 2, grade 2, attempted pass thru VC, immediately identified as esophageal intubation.  Mask vent attempt with glide scope, unable to pass. Attempted with glide and blue stylet, unable to pass. Bag vent easy, sat 97-100%. Kerin Perna uses glide to pass blue stylet, and ETT thru VC. ETCO2 and =BBS.

## 2020-05-01 ENCOUNTER — Encounter (HOSPITAL_COMMUNITY): Payer: Self-pay | Admitting: Gynecologic Oncology

## 2020-05-01 ENCOUNTER — Telehealth: Payer: Self-pay

## 2020-05-01 NOTE — Telephone Encounter (Signed)
Christine Austin states that she is eatin,drinking, and urinating well. She is passing gas. No BM yet. Told her to increase the senokot-S to 2 tabs with 8 oz of water bid. If no BM by Thursday, she can add 1 capful of Miralax bid. Afebrile. Incisions are D&I. Pain controled with prescribed medications. Christine Austin is aware of her post op appointment as well as the office number [phone] to call if she has any questions or concerns.

## 2020-05-02 LAB — CYTOLOGY - NON PAP

## 2020-05-02 LAB — SURGICAL PATHOLOGY

## 2020-05-03 ENCOUNTER — Telehealth: Payer: Self-pay

## 2020-05-03 NOTE — Telephone Encounter (Signed)
TC to patient to report pathology results.  Patient verbalized understanding of all information provided.  Patient has follow up appointment with Dr. Denman George on 05/22/20.  Patient will call with any questions or concerns.

## 2020-05-21 ENCOUNTER — Encounter: Payer: Self-pay | Admitting: Gynecologic Oncology

## 2020-05-22 ENCOUNTER — Other Ambulatory Visit: Payer: Self-pay

## 2020-05-22 ENCOUNTER — Encounter: Payer: Self-pay | Admitting: Gynecologic Oncology

## 2020-05-22 ENCOUNTER — Inpatient Hospital Stay (HOSPITAL_BASED_OUTPATIENT_CLINIC_OR_DEPARTMENT_OTHER): Payer: BC Managed Care – PPO | Admitting: Gynecologic Oncology

## 2020-05-22 VITALS — BP 120/71 | HR 89 | Temp 98.0°F | Resp 18 | Wt 177.1 lb

## 2020-05-22 DIAGNOSIS — Z90710 Acquired absence of both cervix and uterus: Secondary | ICD-10-CM | POA: Diagnosis not present

## 2020-05-22 DIAGNOSIS — N870 Mild cervical dysplasia: Secondary | ICD-10-CM

## 2020-05-22 DIAGNOSIS — Z90722 Acquired absence of ovaries, bilateral: Secondary | ICD-10-CM | POA: Diagnosis not present

## 2020-05-22 NOTE — Patient Instructions (Signed)
Dr Denman George recommends using premarin cream nightly in the vagina for 1 month.  Avoid intercourse for another 5 weeks from today because the vaginal incision has not completely healed.  It is safe to swim, bathe and use hot tubs.  Please follow-up for yearly pap smears of the vagina with Dr Rosana Hoes, for at least 20 years (or 20 years after last abnormal pap).

## 2020-05-22 NOTE — Progress Notes (Signed)
Follow-up Note: Gyn-Onc  Consult was requested by Dr. Barkley Boards for the evaluation of Christine Austin 64 y.o. female  CC:  Chief Complaint  Patient presents with  . CIN I (cervical intraepithelial neoplasia I)    Assessment/Plan:  [name]  is a 64 y.o.  year old with a longstanding history of ASC-H pap and inadequate cervix for excisional procedure, s/p robotic assisted total hysterectomy, BSO on 04/30/20. Pathology revealed CIN I, no dysplasia, benign uterus/tubes/ovaries.  I am recommending follow-up with Dr Barkley Boards or Dr Rosana Hoes in 12 months for vaginal pap and HPV co-testing. She will need vaginal cytology surveillance for at least 20 years.   She has mild mucosal cuff separation in the midline (no dehiscence) and I recommend 4 weeks of vaginal premarin.  HPI: Ms Christine Austin is a 64 year old P4 who was seen in consultation at the request of Dr Barkley Boards for evaluation of ASC-H pap in the setting of high risk HPV disease and inadequate cervical tissue for excisional biopsy.  The patient has had a 7-year history of abnormal Pap tests which culminated in a cone biopsy being performed in 2019 for a preoperative diagnosis of ASCUS positive high-risk HPV.  CIN 2-3 was seen on colposcopic biopsies.  The cone biopsy specimen was free of dysplasia or malignancy.  Following that she had persistent high risk HPV positive Paps but only atypical cells or low-grade cells on cytology.  In 2020 she had an ASCUS high-risk HPV Pap with negative HPV for 16 and 18 that was treated with colposcopic biopsies that showed only atypia but no dysplasia or malignancy.  She return for Pap test in August 2021 and on August 06/11/2020 she had a Pap which was positive for atypical squamous cells cannot rule out high-grade, positive for high risk HPV.  Her gynecologist felt that her cervical exam revealed insufficient cervix for colposcopy or excisional procedure and therefore she was referred to  GYN oncology.  Of note the patient has also been followed for an intrauterine mass and left ovarian masses both very small in size.  An ultrasound on January 30, 2020 revealed a uterus with no abnormality seen, the endometrium thickened with a hyperechoic mass that was less than 1 cm in the fundal wall with no blood flow.  This was stable from February 2021.  Her right ovary was normal and the left ovary contained 2 hyperechoic masses 1 measuring 1.1 cm the other measuring 0.7 cm both stable from February 2021 and avascular.  Interval Hx:  On 04/30/20 she underwent robotic assisted total hysterectomy with BSO. Intraoperative findings were significant for a 6 cm normal-appearing uterus and mildly enlarged left ovary, no other abnormalities.  The cervix was flush with the vagina. Surgery was uncomplicated.  Final pathology revealed CIN-1 in the cervix, no high-grade dysplasia or malignancy.  The uterus, endometrium, and bilateral fallopian tubes and ovaries were all normal.  Since surgery she has done well with no specific complaints.   Current Meds:  Outpatient Encounter Medications as of 05/22/2020  Medication Sig  . Calcium Carbonate-Vitamin D (CALCIUM-VITAMIN D3 PO) Take 2 tablets by mouth daily.  . Cholecalciferol (VITAMIN D) 125 MCG (5000 UT) CAPS Take 5,000 Units by mouth daily.  Marland Kitchen ibuprofen (ADVIL) 800 MG tablet Take 1 tablet (800 mg total) by mouth every 8 (eight) hours as needed for moderate pain. For AFTER surgery only  . Multiple Vitamins-Minerals (MULTIVITAMIN PO) Take 1 tablet by mouth daily.  Marland Kitchen senna-docusate (SENOKOT-S) 8.6-50  MG tablet Take 2 tablets by mouth at bedtime. For AFTER surgery, do not take if having diarrhea  . pravastatin (PRAVACHOL) 20 MG tablet Take 20 mg by mouth every morning. (Patient not taking: Reported on 05/21/2020)  . [DISCONTINUED] traMADol (ULTRAM) 50 MG tablet Take 1 tablet (50 mg total) by mouth every 6 (six) hours as needed for severe pain. For AFTER  surgery only, do not take and drive (Patient not taking: Reported on 05/21/2020)   No facility-administered encounter medications on file as of 05/22/2020.    Allergy:  Allergies  Allergen Reactions  . Tramadol Other (See Comments)    Increased Heart rate, Bad Headache over left eye    Social Hx:   Social History   Socioeconomic History  . Marital status: Married    Spouse name: Not on file  . Number of children: Not on file  . Years of education: Not on file  . Highest education level: Not on file  Occupational History  . Not on file  Tobacco Use  . Smoking status: Never Smoker  . Smokeless tobacco: Never Used  Vaping Use  . Vaping Use: Never used  Substance and Sexual Activity  . Alcohol use: Yes    Alcohol/week: 3.0 - 4.0 standard drinks    Types: 3 - 4 Standard drinks or equivalent per week    Comment: wine/mixed drinks  . Drug use: Never  . Sexual activity: Not Currently    Birth control/protection: Post-menopausal  Other Topics Concern  . Not on file  Social History Narrative  . Not on file   Social Determinants of Health   Financial Resource Strain:   . Difficulty of Paying Living Expenses: Not on file  Food Insecurity:   . Worried About Charity fundraiser in the Last Year: Not on file  . Ran Out of Food in the Last Year: Not on file  Transportation Needs:   . Lack of Transportation (Medical): Not on file  . Lack of Transportation (Non-Medical): Not on file  Physical Activity:   . Days of Exercise per Week: Not on file  . Minutes of Exercise per Session: Not on file  Stress:   . Feeling of Stress : Not on file  Social Connections:   . Frequency of Communication with Friends and Family: Not on file  . Frequency of Social Gatherings with Friends and Family: Not on file  . Attends Religious Services: Not on file  . Active Member of Clubs or Organizations: Not on file  . Attends Archivist Meetings: Not on file  . Marital Status: Not on file   Intimate Partner Violence:   . Fear of Current or Ex-Partner: Not on file  . Emotionally Abused: Not on file  . Physically Abused: Not on file  . Sexually Abused: Not on file    Past Surgical Hx:  Past Surgical History:  Procedure Laterality Date  . CERVICAL CONIZATION W/BX N/A 01/21/2018   Procedure: CONIZATION CERVIX WITH BIOPSY;  Surgeon: Thurnell Lose, MD;  Location: [facility];  Service: Gynecology;  Laterality: N/A;  . COLONOSCOPY    . conziation of cervix  11/2017  . DILATION AND CURETTAGE OF UTERUS     x 2   . MOHS SURGERY     Left leg  . RHINOPLASTY    . ROBOTIC ASSISTED TOTAL HYSTERECTOMY WITH BILATERAL SALPINGO OOPHERECTOMY Bilateral 04/30/2020   Procedure: XI ROBOTIC ASSISTED TOTAL HYSTERECTOMY WITH BILATERAL SALPINGO OOPHORECTOMY;  Surgeon: Everitt Amber, MD;  Location:  [facility];  Service: Gynecology;  Laterality: Bilateral;  . TONSILLECTOMY    . TONSILLECTOMY AND ADENOIDECTOMY    . TUBAL LIGATION    . WISDOM TOOTH EXTRACTION      Past Medical Hx:  Past Medical History:  Diagnosis Date  . Basal cell carcinoma of left lower leg   . Hyperlipidemia   . PONV (postoperative nausea and vomiting)   . SVD (spontaneous vaginal delivery)    x4    Past Gynecological History:  +HPV, dysplasia, abnormal uterine bleeding (remote) No LMP recorded (lmp unknown). Patient is postmenopausal.  Family Hx:  Family History  Problem Relation Age of Onset  . Heart attack Mother   . Cancer Father 22       liver (nonalcoholic)  . Cancer Sister 45       colon  . Cancer Brother 3       prostate    Review of Systems:  Constitutional  Feels well,    ENT Normal appearing ears and nares bilaterally Skin/Breast  No rash, sores, jaundice, itching, dryness Cardiovascular  No chest pain, shortness of breath, or edema  Pulmonary  No cough or wheeze.  Gastro Intestinal  No nausea, vomitting, or diarrhoea. No bright red blood per rectum, no abdominal pain, change in bowel movement, or  constipation.  Genito Urinary  No frequency, urgency, dysuria, no bleeding Musculo Skeletal  No myalgia, arthralgia, joint swelling or pain  Neurologic  No weakness, numbness, change in gait,  Psychology  No depression, anxiety, insomnia.   Vitals:  Blood pressure 120/71, pulse 89, temperature 98 F (36.7 C), temperature source Tympanic, resp. rate 18, weight 177 lb 1.6 oz (80.3 kg), SpO2 98 %.  Physical Exam: WD in NAD Neck  Supple NROM, without any enlargements.  Lymph Node Survey No cervical supraclavicular or inguinal adenopathy Cardiovascular  Pulse normal rate, regularity and rhythm. S1 and S2 normal.  Lungs  Clear to auscultation bilateraly, without wheezes/crackles/rhonchi. Good air movement.  Skin  No rash/lesions/breakdown  Psychiatry  Alert and oriented to person, place, and time  Abdomen  Normoactive bowel sounds, abdomen soft, non-tender and nonobese without evidence of hernia. Incisions well-healed Back No CVA tenderness Genito Urinary  Vaginal cuff 1 cm area of hyperemia consistent with mucosal separation at the midline vaginal cuff.  The cuff was palpably intact with no gapping and no suture material present Rectal  deferred  Extremities  No bilateral cyanosis, clubbing or edema.    [name], MD  05/22/2020, 2:53 PM DD: Dr Florene Route

## 2020-06-25 DIAGNOSIS — E7800 Pure hypercholesterolemia, unspecified: Secondary | ICD-10-CM | POA: Diagnosis not present

## 2020-06-25 DIAGNOSIS — R748 Abnormal levels of other serum enzymes: Secondary | ICD-10-CM | POA: Diagnosis not present

## 2020-06-27 ENCOUNTER — Other Ambulatory Visit: Payer: Self-pay | Admitting: Family Medicine

## 2020-06-27 DIAGNOSIS — R748 Abnormal levels of other serum enzymes: Secondary | ICD-10-CM

## 2020-06-28 ENCOUNTER — Ambulatory Visit
Admission: RE | Admit: 2020-06-28 | Discharge: 2020-06-28 | Disposition: A | Discharge status: Home / Self Care | Payer: BC Managed Care – PPO | Source: Ambulatory Visit | Attending: Family Medicine | Admitting: Family Medicine

## 2020-06-28 DIAGNOSIS — R748 Abnormal levels of other serum enzymes: Secondary | ICD-10-CM

## 2020-06-28 DIAGNOSIS — R945 Abnormal results of liver function studies: Secondary | ICD-10-CM | POA: Diagnosis not present

## 2020-08-28 DIAGNOSIS — L920 Granuloma annulare: Secondary | ICD-10-CM | POA: Diagnosis not present

## 2020-12-06 DIAGNOSIS — L920 Granuloma annulare: Secondary | ICD-10-CM | POA: Diagnosis not present

## 2021-01-21 DIAGNOSIS — Z0000 Encounter for general adult medical examination without abnormal findings: Secondary | ICD-10-CM | POA: Diagnosis not present

## 2021-01-21 DIAGNOSIS — Z86010 Personal history of colonic polyps: Secondary | ICD-10-CM | POA: Diagnosis not present

## 2021-01-21 DIAGNOSIS — E7800 Pure hypercholesterolemia, unspecified: Secondary | ICD-10-CM | POA: Diagnosis not present

## 2021-01-21 DIAGNOSIS — E559 Vitamin D deficiency, unspecified: Secondary | ICD-10-CM | POA: Diagnosis not present

## 2021-01-21 DIAGNOSIS — M8580 Other specified disorders of bone density and structure, unspecified site: Secondary | ICD-10-CM | POA: Diagnosis not present

## 2021-01-21 DIAGNOSIS — K760 Fatty (change of) liver, not elsewhere classified: Secondary | ICD-10-CM | POA: Diagnosis not present

## 2021-01-21 DIAGNOSIS — Z23 Encounter for immunization: Secondary | ICD-10-CM | POA: Diagnosis not present

## 2021-01-30 DIAGNOSIS — Z01419 Encounter for gynecological examination (general) (routine) without abnormal findings: Secondary | ICD-10-CM | POA: Diagnosis not present

## 2021-01-30 DIAGNOSIS — N870 Mild cervical dysplasia: Secondary | ICD-10-CM | POA: Diagnosis not present

## 2021-01-30 DIAGNOSIS — Z1231 Encounter for screening mammogram for malignant neoplasm of breast: Secondary | ICD-10-CM | POA: Diagnosis not present

## 2021-02-06 DIAGNOSIS — L570 Actinic keratosis: Secondary | ICD-10-CM | POA: Diagnosis not present

## 2021-02-06 DIAGNOSIS — L820 Inflamed seborrheic keratosis: Secondary | ICD-10-CM | POA: Diagnosis not present

## 2021-02-06 DIAGNOSIS — L821 Other seborrheic keratosis: Secondary | ICD-10-CM | POA: Diagnosis not present

## 2021-02-06 DIAGNOSIS — D485 Neoplasm of uncertain behavior of skin: Secondary | ICD-10-CM | POA: Diagnosis not present

## 2021-02-06 DIAGNOSIS — Z85828 Personal history of other malignant neoplasm of skin: Secondary | ICD-10-CM | POA: Diagnosis not present

## 2021-02-06 DIAGNOSIS — L814 Other melanin hyperpigmentation: Secondary | ICD-10-CM | POA: Diagnosis not present

## 2021-02-06 DIAGNOSIS — D225 Melanocytic nevi of trunk: Secondary | ICD-10-CM | POA: Diagnosis not present

## 2021-02-08 DIAGNOSIS — R928 Other abnormal and inconclusive findings on diagnostic imaging of breast: Secondary | ICD-10-CM | POA: Diagnosis not present

## 2021-02-08 DIAGNOSIS — R922 Inconclusive mammogram: Secondary | ICD-10-CM | POA: Diagnosis not present

## 2021-03-04 DIAGNOSIS — H04123 Dry eye syndrome of bilateral lacrimal glands: Secondary | ICD-10-CM | POA: Diagnosis not present

## 2021-03-04 DIAGNOSIS — H2513 Age-related nuclear cataract, bilateral: Secondary | ICD-10-CM | POA: Diagnosis not present

## 2021-03-04 DIAGNOSIS — G510 Bell's palsy: Secondary | ICD-10-CM | POA: Diagnosis not present

## 2021-04-16 DIAGNOSIS — N952 Postmenopausal atrophic vaginitis: Secondary | ICD-10-CM | POA: Diagnosis not present

## 2021-05-09 DIAGNOSIS — G510 Bell's palsy: Secondary | ICD-10-CM | POA: Diagnosis not present

## 2021-05-09 DIAGNOSIS — H04123 Dry eye syndrome of bilateral lacrimal glands: Secondary | ICD-10-CM | POA: Diagnosis not present

## 2021-05-09 DIAGNOSIS — H2513 Age-related nuclear cataract, bilateral: Secondary | ICD-10-CM | POA: Diagnosis not present

## 2021-06-03 DIAGNOSIS — L920 Granuloma annulare: Secondary | ICD-10-CM | POA: Diagnosis not present

## 2021-07-09 DIAGNOSIS — H04123 Dry eye syndrome of bilateral lacrimal glands: Secondary | ICD-10-CM | POA: Diagnosis not present

## 2021-07-09 DIAGNOSIS — G528 Disorders of other specified cranial nerves: Secondary | ICD-10-CM | POA: Diagnosis not present

## 2021-07-09 DIAGNOSIS — G510 Bell's palsy: Secondary | ICD-10-CM | POA: Diagnosis not present

## 2021-08-03 DIAGNOSIS — H1031 Unspecified acute conjunctivitis, right eye: Secondary | ICD-10-CM | POA: Diagnosis not present

## 2021-08-03 DIAGNOSIS — T1591XA Foreign body on external eye, part unspecified, right eye, initial encounter: Secondary | ICD-10-CM | POA: Diagnosis not present

## 2022-03-05 DIAGNOSIS — G510 Bell's palsy: Secondary | ICD-10-CM | POA: Diagnosis not present

## 2022-03-05 DIAGNOSIS — H04123 Dry eye syndrome of bilateral lacrimal glands: Secondary | ICD-10-CM | POA: Diagnosis not present

## 2022-03-05 DIAGNOSIS — H2513 Age-related nuclear cataract, bilateral: Secondary | ICD-10-CM | POA: Diagnosis not present

## 2022-10-17 ENCOUNTER — Other Ambulatory Visit: Payer: Self-pay | Admitting: Infectious Diseases

## 2022-10-17 DIAGNOSIS — Z1329 Encounter for screening for other suspected endocrine disorder: Secondary | ICD-10-CM | POA: Diagnosis not present

## 2022-10-17 DIAGNOSIS — Z131 Encounter for screening for diabetes mellitus: Secondary | ICD-10-CM | POA: Diagnosis not present

## 2022-10-18 LAB — HEMOGLOBIN A1C W/OUT EAG: Hgb A1c MFr Bld: 5.7 % of total Hgb — ABNORMAL HIGH (ref <5.7)

## 2022-10-18 LAB — TSH: TSH: 1.49 mIU/L (ref 0.40–4.50)

## 2022-12-09 IMAGING — US US ABDOMEN LIMITED
1 series · 14 of 25 positions shown · non-contrast
Comparison: None.

CLINICAL DATA: Elevated liver function tests.

EXAM:
ULTRASOUND ABDOMEN LIMITED RIGHT UPPER QUADRANT

[Series 1: us abdomen limited · 0.19mm/px · 14 of 41 slices shown]
[im 1/41]
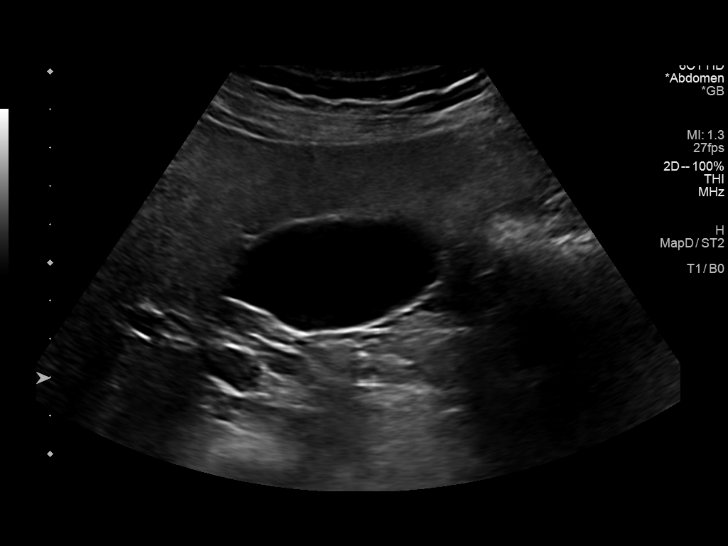
[im 4/41]
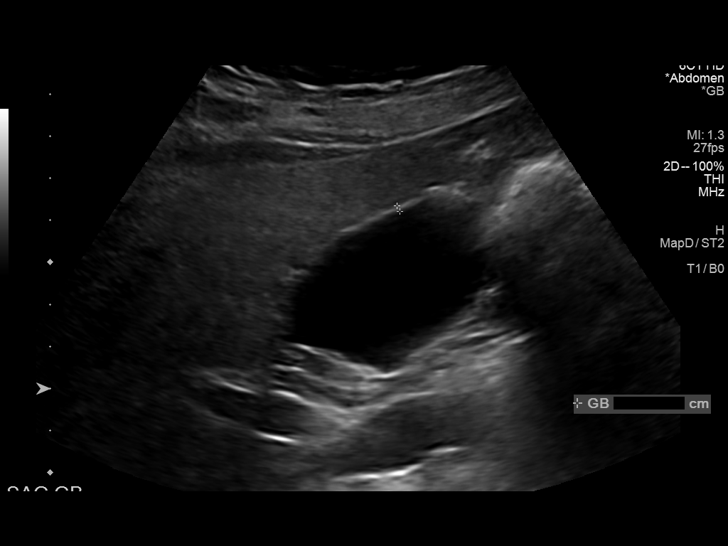
[im 7/41]
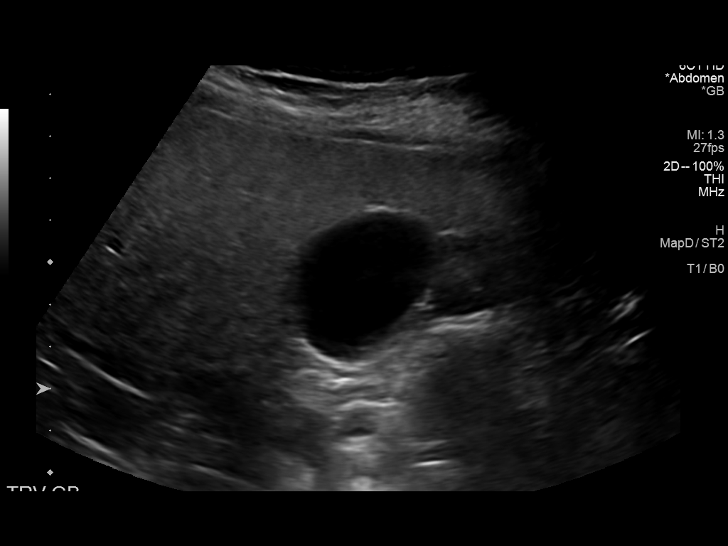
[im 11/41]
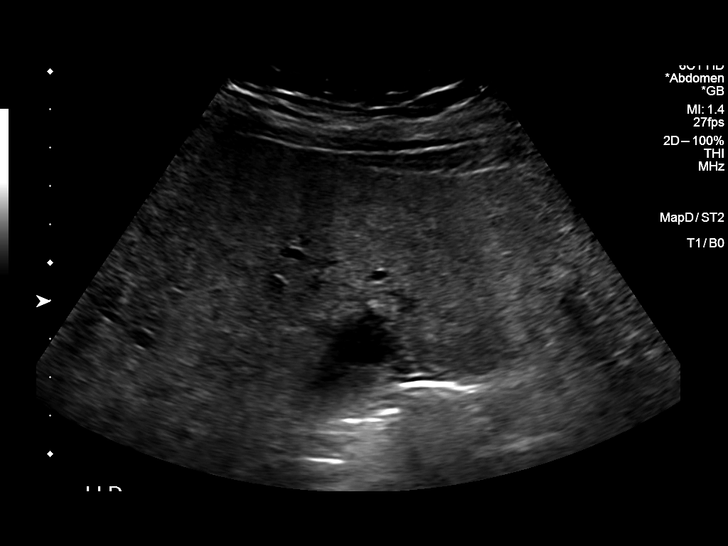
[im 14/41]
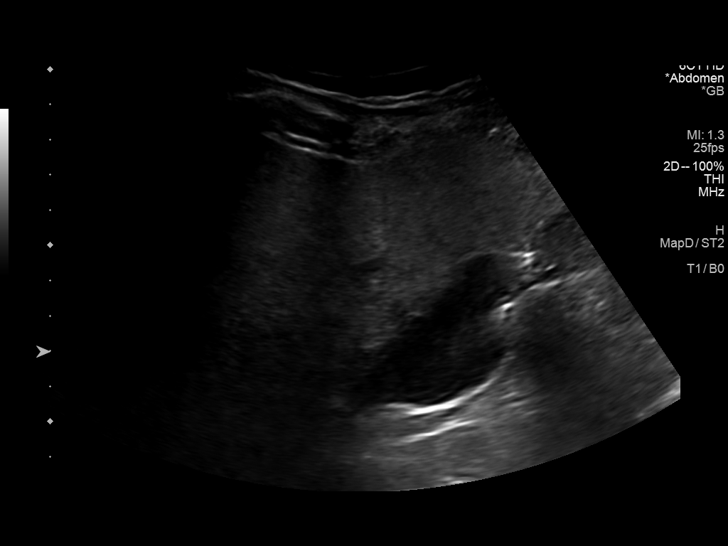
[im 16/41]
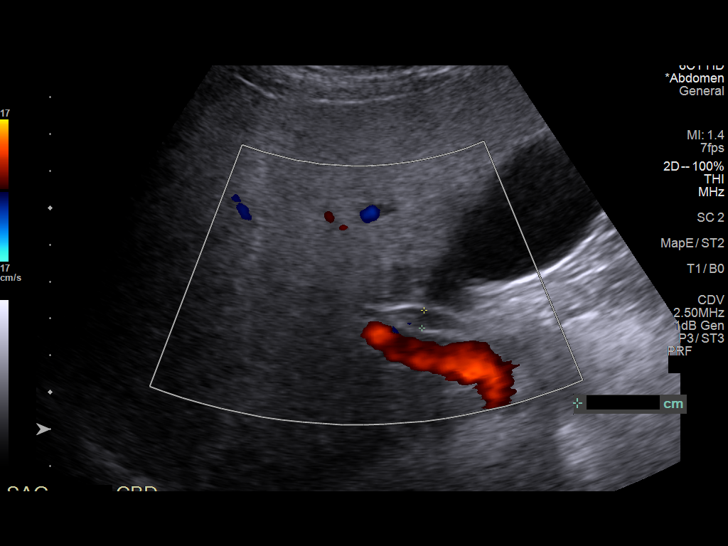
[im 19/41]
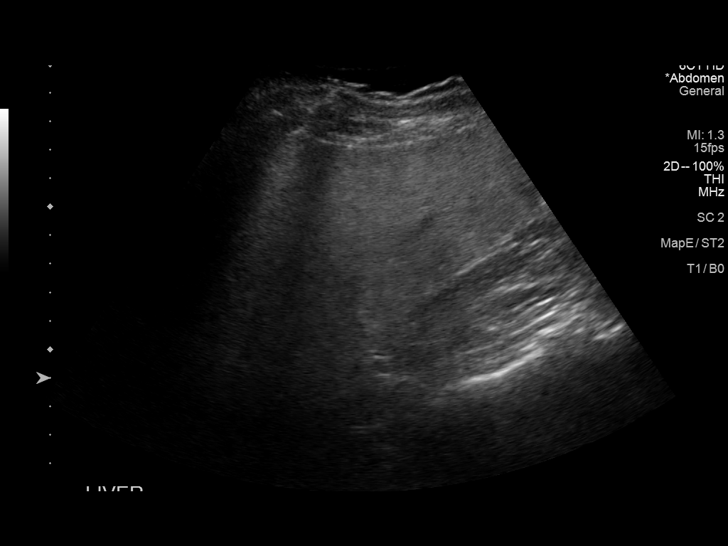
[im 22/41]
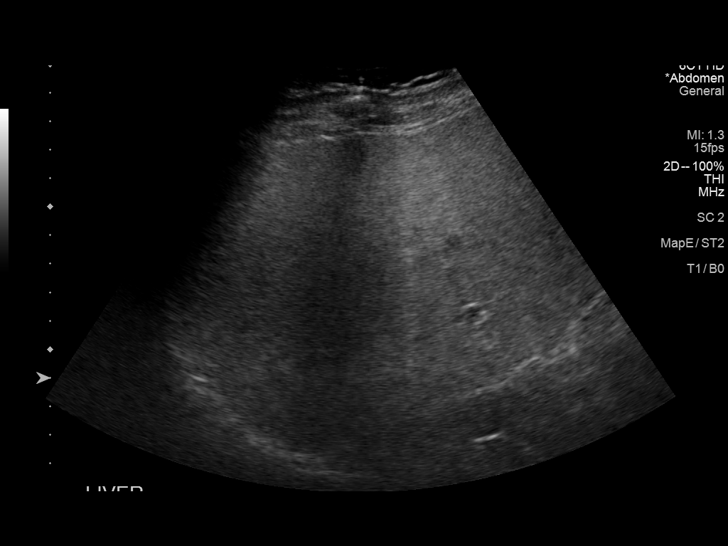
[im 26/41]
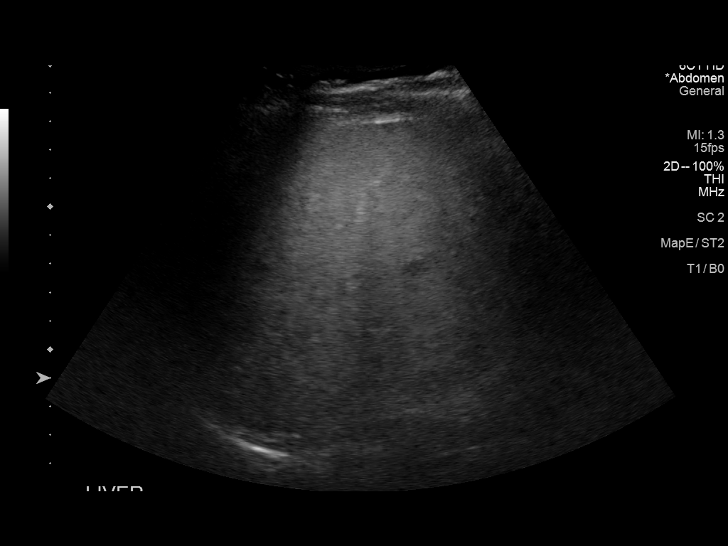
[im 27/41]
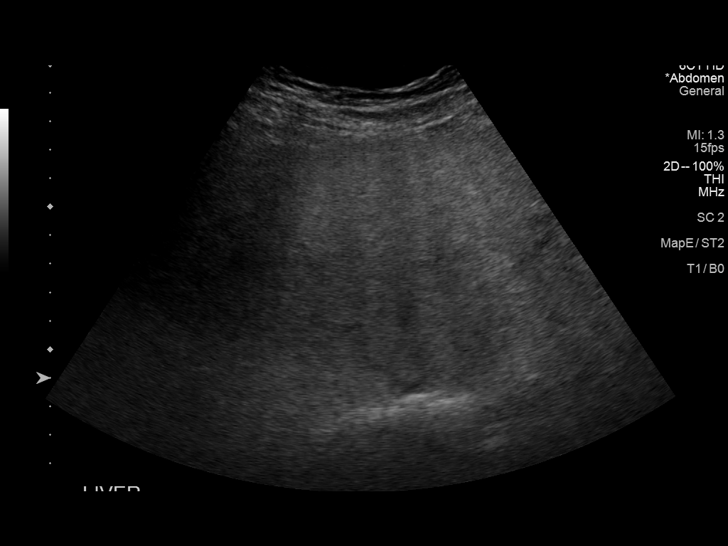
[im 31/41]
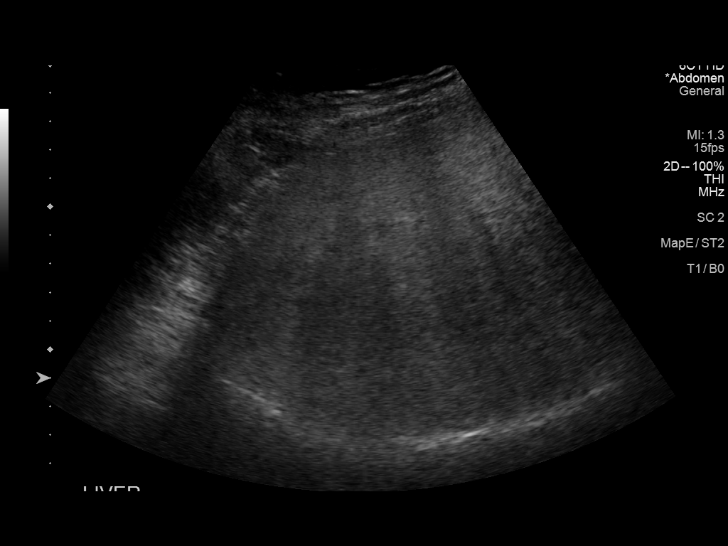
[im 34/41]
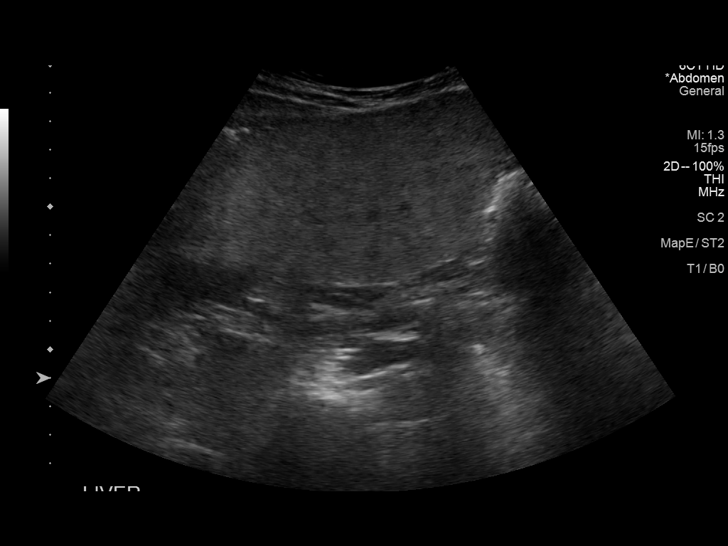
[im 37/41]
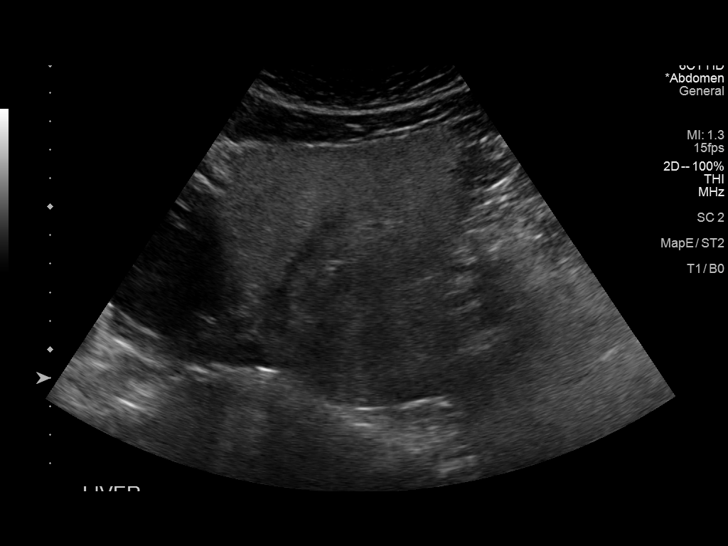
[im 41/41]
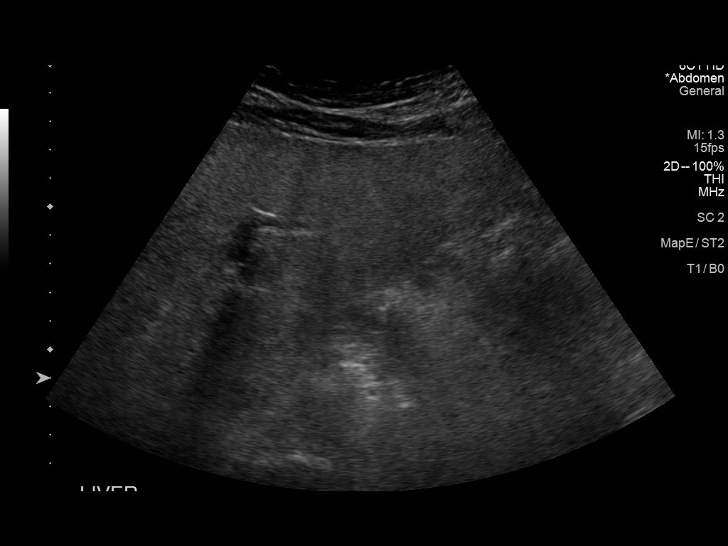

[14 of 25 positions shown; findings below may reference images not displayed]

FINDINGS: Gallbladder:

No gallstones or wall thickening visualized. No sonographic Murphy
sign noted by sonographer.

Common bile duct:

Diameter: 5 mm.

Liver:

The liver demonstrates coarse echotexture and increased
echogenicity, likely reflecting diffuse steatosis. No overt
cirrhotic contour abnormalities or focal lesions are identified.
There is no evidence of intrahepatic biliary ductal dilatation.
portal vein is patent on color Doppler imaging with normal direction
of blood flow towards the liver.

Other: No ascites identified in the right upper quadrant.
IMPRESSION: Increased echogenicity and heterogeneity of the liver parenchyma
likely reflects hepatic steatosis.

## 2023-02-02 DIAGNOSIS — Z87410 Personal history of cervical dysplasia: Secondary | ICD-10-CM | POA: Diagnosis not present

## 2023-02-02 DIAGNOSIS — Z131 Encounter for screening for diabetes mellitus: Secondary | ICD-10-CM | POA: Diagnosis not present

## 2023-02-02 DIAGNOSIS — E7800 Pure hypercholesterolemia, unspecified: Secondary | ICD-10-CM | POA: Diagnosis not present

## 2023-02-02 DIAGNOSIS — Z01419 Encounter for gynecological examination (general) (routine) without abnormal findings: Secondary | ICD-10-CM | POA: Diagnosis not present

## 2023-02-04 DIAGNOSIS — R7303 Prediabetes: Secondary | ICD-10-CM | POA: Diagnosis not present

## 2023-02-04 DIAGNOSIS — Z6825 Body mass index (BMI) 25.0-25.9, adult: Secondary | ICD-10-CM | POA: Diagnosis not present

## 2023-02-04 DIAGNOSIS — L920 Granuloma annulare: Secondary | ICD-10-CM | POA: Diagnosis not present

## 2023-02-04 DIAGNOSIS — E663 Overweight: Secondary | ICD-10-CM | POA: Diagnosis not present

## 2023-02-04 DIAGNOSIS — Z0000 Encounter for general adult medical examination without abnormal findings: Secondary | ICD-10-CM | POA: Diagnosis not present

## 2023-02-04 DIAGNOSIS — M8580 Other specified disorders of bone density and structure, unspecified site: Secondary | ICD-10-CM | POA: Diagnosis not present

## 2023-02-09 DIAGNOSIS — Z1231 Encounter for screening mammogram for malignant neoplasm of breast: Secondary | ICD-10-CM | POA: Diagnosis not present

## 2023-02-16 DIAGNOSIS — R922 Inconclusive mammogram: Secondary | ICD-10-CM | POA: Diagnosis not present

## 2023-02-25 DIAGNOSIS — Z86018 Personal history of other benign neoplasm: Secondary | ICD-10-CM | POA: Diagnosis not present

## 2023-02-25 DIAGNOSIS — L910 Hypertrophic scar: Secondary | ICD-10-CM | POA: Diagnosis not present

## 2023-02-25 DIAGNOSIS — L821 Other seborrheic keratosis: Secondary | ICD-10-CM | POA: Diagnosis not present

## 2023-02-25 DIAGNOSIS — Z85828 Personal history of other malignant neoplasm of skin: Secondary | ICD-10-CM | POA: Diagnosis not present

## 2023-02-25 DIAGNOSIS — D225 Melanocytic nevi of trunk: Secondary | ICD-10-CM | POA: Diagnosis not present

## 2023-02-25 DIAGNOSIS — L920 Granuloma annulare: Secondary | ICD-10-CM | POA: Diagnosis not present

## 2023-02-25 DIAGNOSIS — L578 Other skin changes due to chronic exposure to nonionizing radiation: Secondary | ICD-10-CM | POA: Diagnosis not present

## 2023-02-25 DIAGNOSIS — L814 Other melanin hyperpigmentation: Secondary | ICD-10-CM | POA: Diagnosis not present

## 2023-03-11 DIAGNOSIS — G510 Bell's palsy: Secondary | ICD-10-CM | POA: Diagnosis not present

## 2023-03-11 DIAGNOSIS — H04123 Dry eye syndrome of bilateral lacrimal glands: Secondary | ICD-10-CM | POA: Diagnosis not present

## 2023-03-11 DIAGNOSIS — H2513 Age-related nuclear cataract, bilateral: Secondary | ICD-10-CM | POA: Diagnosis not present

## 2024-02-08 DIAGNOSIS — L920 Granuloma annulare: Secondary | ICD-10-CM | POA: Diagnosis not present

## 2024-02-08 DIAGNOSIS — M8580 Other specified disorders of bone density and structure, unspecified site: Secondary | ICD-10-CM | POA: Diagnosis not present

## 2024-02-08 DIAGNOSIS — Z6824 Body mass index (BMI) 24.0-24.9, adult: Secondary | ICD-10-CM | POA: Diagnosis not present

## 2024-02-08 DIAGNOSIS — Z860102 Personal history of hyperplastic colon polyps: Secondary | ICD-10-CM | POA: Diagnosis not present

## 2024-02-08 DIAGNOSIS — E7800 Pure hypercholesterolemia, unspecified: Secondary | ICD-10-CM | POA: Diagnosis not present

## 2024-02-08 DIAGNOSIS — E559 Vitamin D deficiency, unspecified: Secondary | ICD-10-CM | POA: Diagnosis not present

## 2024-02-08 DIAGNOSIS — R7303 Prediabetes: Secondary | ICD-10-CM | POA: Diagnosis not present

## 2024-02-08 DIAGNOSIS — Z1331 Encounter for screening for depression: Secondary | ICD-10-CM | POA: Diagnosis not present

## 2024-02-08 DIAGNOSIS — I6523 Occlusion and stenosis of bilateral carotid arteries: Secondary | ICD-10-CM | POA: Diagnosis not present

## 2024-02-08 DIAGNOSIS — Z0000 Encounter for general adult medical examination without abnormal findings: Secondary | ICD-10-CM | POA: Diagnosis not present

## 2024-02-15 DIAGNOSIS — Z1231 Encounter for screening mammogram for malignant neoplasm of breast: Secondary | ICD-10-CM | POA: Diagnosis not present

## 2024-02-18 DIAGNOSIS — Z87410 Personal history of cervical dysplasia: Secondary | ICD-10-CM | POA: Diagnosis not present

## 2024-02-18 DIAGNOSIS — Z01419 Encounter for gynecological examination (general) (routine) without abnormal findings: Secondary | ICD-10-CM | POA: Diagnosis not present

## 2024-03-02 DIAGNOSIS — L719 Rosacea, unspecified: Secondary | ICD-10-CM | POA: Diagnosis not present

## 2024-03-02 DIAGNOSIS — L821 Other seborrheic keratosis: Secondary | ICD-10-CM | POA: Diagnosis not present

## 2024-03-02 DIAGNOSIS — Z79899 Other long term (current) drug therapy: Secondary | ICD-10-CM | POA: Diagnosis not present

## 2024-03-02 DIAGNOSIS — Z85828 Personal history of other malignant neoplasm of skin: Secondary | ICD-10-CM | POA: Diagnosis not present

## 2024-03-02 DIAGNOSIS — L910 Hypertrophic scar: Secondary | ICD-10-CM | POA: Diagnosis not present

## 2024-03-02 DIAGNOSIS — L920 Granuloma annulare: Secondary | ICD-10-CM | POA: Diagnosis not present

## 2024-03-02 DIAGNOSIS — L570 Actinic keratosis: Secondary | ICD-10-CM | POA: Diagnosis not present

## 2024-03-02 DIAGNOSIS — D225 Melanocytic nevi of trunk: Secondary | ICD-10-CM | POA: Diagnosis not present

## 2024-03-02 DIAGNOSIS — Z86018 Personal history of other benign neoplasm: Secondary | ICD-10-CM | POA: Diagnosis not present

## 2024-03-02 DIAGNOSIS — L814 Other melanin hyperpigmentation: Secondary | ICD-10-CM | POA: Diagnosis not present

## 2024-03-02 DIAGNOSIS — L578 Other skin changes due to chronic exposure to nonionizing radiation: Secondary | ICD-10-CM | POA: Diagnosis not present

## 2024-03-16 DIAGNOSIS — H04123 Dry eye syndrome of bilateral lacrimal glands: Secondary | ICD-10-CM | POA: Diagnosis not present

## 2024-03-16 DIAGNOSIS — Z79899 Other long term (current) drug therapy: Secondary | ICD-10-CM | POA: Diagnosis not present

## 2024-03-16 DIAGNOSIS — G510 Bell's palsy: Secondary | ICD-10-CM | POA: Diagnosis not present

## 2024-03-16 DIAGNOSIS — H2513 Age-related nuclear cataract, bilateral: Secondary | ICD-10-CM | POA: Diagnosis not present
